# Patient Record
Sex: Female | Born: 1935
Health system: Southern US, Community
[De-identification: ages and names within clinical notes are randomized; demographics above are authoritative.]

---

## 2007-07-10 ENCOUNTER — Ambulatory Visit: Payer: Self-pay | Admitting: Ophthalmology

## 2007-07-10 ENCOUNTER — Other Ambulatory Visit: Payer: Self-pay

## 2007-07-16 ENCOUNTER — Ambulatory Visit: Payer: Self-pay | Admitting: Ophthalmology

## 2009-05-06 ENCOUNTER — Ambulatory Visit: Payer: Self-pay | Admitting: Unknown Physician Specialty

## 2009-06-26 ENCOUNTER — Ambulatory Visit: Payer: Self-pay | Admitting: Ophthalmology

## 2009-07-01 ENCOUNTER — Ambulatory Visit: Payer: Self-pay | Admitting: Cardiology

## 2009-07-06 ENCOUNTER — Ambulatory Visit: Payer: Self-pay | Admitting: Ophthalmology

## 2013-12-23 ENCOUNTER — Ambulatory Visit: Payer: Self-pay | Admitting: Internal Medicine

## 2018-04-18 DIAGNOSIS — N183 Chronic kidney disease, stage 3 (moderate): Secondary | ICD-10-CM | POA: Diagnosis not present

## 2018-04-18 DIAGNOSIS — E1122 Type 2 diabetes mellitus with diabetic chronic kidney disease: Secondary | ICD-10-CM | POA: Diagnosis not present

## 2018-04-18 DIAGNOSIS — I119 Hypertensive heart disease without heart failure: Secondary | ICD-10-CM | POA: Diagnosis not present

## 2018-04-18 DIAGNOSIS — I43 Cardiomyopathy in diseases classified elsewhere: Secondary | ICD-10-CM | POA: Diagnosis not present

## 2018-09-18 DIAGNOSIS — H401132 Primary open-angle glaucoma, bilateral, moderate stage: Secondary | ICD-10-CM | POA: Diagnosis not present

## 2019-01-21 DIAGNOSIS — N183 Chronic kidney disease, stage 3 unspecified: Secondary | ICD-10-CM | POA: Diagnosis not present

## 2019-01-21 DIAGNOSIS — E1122 Type 2 diabetes mellitus with diabetic chronic kidney disease: Secondary | ICD-10-CM | POA: Diagnosis not present

## 2019-01-21 DIAGNOSIS — E782 Mixed hyperlipidemia: Secondary | ICD-10-CM | POA: Diagnosis not present

## 2019-01-22 DIAGNOSIS — E782 Mixed hyperlipidemia: Secondary | ICD-10-CM | POA: Diagnosis not present

## 2019-01-22 DIAGNOSIS — E1122 Type 2 diabetes mellitus with diabetic chronic kidney disease: Secondary | ICD-10-CM | POA: Diagnosis not present

## 2019-01-22 DIAGNOSIS — N183 Chronic kidney disease, stage 3 unspecified: Secondary | ICD-10-CM | POA: Diagnosis not present

## 2019-03-25 DIAGNOSIS — R8290 Unspecified abnormal findings in urine: Secondary | ICD-10-CM | POA: Diagnosis not present

## 2019-03-25 DIAGNOSIS — R350 Frequency of micturition: Secondary | ICD-10-CM | POA: Diagnosis not present

## 2019-05-22 DIAGNOSIS — E1122 Type 2 diabetes mellitus with diabetic chronic kidney disease: Secondary | ICD-10-CM | POA: Diagnosis not present

## 2019-05-22 DIAGNOSIS — E782 Mixed hyperlipidemia: Secondary | ICD-10-CM | POA: Diagnosis not present

## 2019-05-22 DIAGNOSIS — N1831 Chronic kidney disease, stage 3a: Secondary | ICD-10-CM | POA: Diagnosis not present

## 2019-05-22 DIAGNOSIS — I129 Hypertensive chronic kidney disease with stage 1 through stage 4 chronic kidney disease, or unspecified chronic kidney disease: Secondary | ICD-10-CM | POA: Diagnosis not present

## 2020-06-01 ENCOUNTER — Other Ambulatory Visit: Payer: Self-pay

## 2020-06-01 ENCOUNTER — Encounter: Payer: Self-pay | Admitting: Radiology

## 2020-06-01 ENCOUNTER — Emergency Department: Payer: Medicare HMO

## 2020-06-01 ENCOUNTER — Inpatient Hospital Stay
Admission: EM | Admit: 2020-06-01 | Discharge: 2020-06-05 | DRG: 682 | Disposition: A | Discharge status: Skilled Nursing Facility | Payer: Medicare HMO | Attending: Student | Admitting: Student

## 2020-06-01 DIAGNOSIS — I43 Cardiomyopathy in diseases classified elsewhere: Secondary | ICD-10-CM | POA: Diagnosis present

## 2020-06-01 DIAGNOSIS — I82409 Acute embolism and thrombosis of unspecified deep veins of unspecified lower extremity: Secondary | ICD-10-CM

## 2020-06-01 DIAGNOSIS — R32 Unspecified urinary incontinence: Secondary | ICD-10-CM | POA: Diagnosis present

## 2020-06-01 DIAGNOSIS — R4182 Altered mental status, unspecified: Secondary | ICD-10-CM | POA: Diagnosis not present

## 2020-06-01 DIAGNOSIS — M7989 Other specified soft tissue disorders: Secondary | ICD-10-CM | POA: Diagnosis not present

## 2020-06-01 DIAGNOSIS — E785 Hyperlipidemia, unspecified: Secondary | ICD-10-CM | POA: Diagnosis present

## 2020-06-01 DIAGNOSIS — H9190 Unspecified hearing loss, unspecified ear: Secondary | ICD-10-CM | POA: Diagnosis present

## 2020-06-01 DIAGNOSIS — M419 Scoliosis, unspecified: Secondary | ICD-10-CM | POA: Diagnosis present

## 2020-06-01 DIAGNOSIS — G9341 Metabolic encephalopathy: Secondary | ICD-10-CM | POA: Diagnosis not present

## 2020-06-01 DIAGNOSIS — E559 Vitamin D deficiency, unspecified: Secondary | ICD-10-CM | POA: Diagnosis present

## 2020-06-01 DIAGNOSIS — I447 Left bundle-branch block, unspecified: Secondary | ICD-10-CM | POA: Diagnosis present

## 2020-06-01 DIAGNOSIS — W010XXA Fall on same level from slipping, tripping and stumbling without subsequent striking against object, initial encounter: Secondary | ICD-10-CM | POA: Diagnosis present

## 2020-06-01 DIAGNOSIS — Z79899 Other long term (current) drug therapy: Secondary | ICD-10-CM

## 2020-06-01 DIAGNOSIS — I82412 Acute embolism and thrombosis of left femoral vein: Secondary | ICD-10-CM | POA: Diagnosis present

## 2020-06-01 DIAGNOSIS — M6282 Rhabdomyolysis: Secondary | ICD-10-CM

## 2020-06-01 DIAGNOSIS — N179 Acute kidney failure, unspecified: Secondary | ICD-10-CM | POA: Diagnosis not present

## 2020-06-01 DIAGNOSIS — Y92009 Unspecified place in unspecified non-institutional (private) residence as the place of occurrence of the external cause: Secondary | ICD-10-CM

## 2020-06-01 DIAGNOSIS — I129 Hypertensive chronic kidney disease with stage 1 through stage 4 chronic kidney disease, or unspecified chronic kidney disease: Secondary | ICD-10-CM | POA: Diagnosis present

## 2020-06-01 DIAGNOSIS — R410 Disorientation, unspecified: Secondary | ICD-10-CM | POA: Diagnosis not present

## 2020-06-01 DIAGNOSIS — M25551 Pain in right hip: Secondary | ICD-10-CM | POA: Diagnosis present

## 2020-06-01 DIAGNOSIS — E1122 Type 2 diabetes mellitus with diabetic chronic kidney disease: Secondary | ICD-10-CM | POA: Diagnosis present

## 2020-06-01 DIAGNOSIS — N183 Chronic kidney disease, stage 3 unspecified: Secondary | ICD-10-CM | POA: Diagnosis not present

## 2020-06-01 DIAGNOSIS — R296 Repeated falls: Secondary | ICD-10-CM | POA: Diagnosis present

## 2020-06-01 DIAGNOSIS — W19XXXD Unspecified fall, subsequent encounter: Secondary | ICD-10-CM | POA: Diagnosis not present

## 2020-06-01 DIAGNOSIS — S0003XA Contusion of scalp, initial encounter: Secondary | ICD-10-CM | POA: Diagnosis present

## 2020-06-01 DIAGNOSIS — Z90710 Acquired absence of both cervix and uterus: Secondary | ICD-10-CM | POA: Diagnosis not present

## 2020-06-01 DIAGNOSIS — W19XXXA Unspecified fall, initial encounter: Secondary | ICD-10-CM

## 2020-06-01 DIAGNOSIS — R279 Unspecified lack of coordination: Secondary | ICD-10-CM | POA: Diagnosis not present

## 2020-06-01 DIAGNOSIS — E1136 Type 2 diabetes mellitus with diabetic cataract: Secondary | ICD-10-CM | POA: Diagnosis present

## 2020-06-01 DIAGNOSIS — T796XXD Traumatic ischemia of muscle, subsequent encounter: Secondary | ICD-10-CM | POA: Diagnosis not present

## 2020-06-01 DIAGNOSIS — T796XXA Traumatic ischemia of muscle, initial encounter: Secondary | ICD-10-CM | POA: Diagnosis not present

## 2020-06-01 DIAGNOSIS — I82402 Acute embolism and thrombosis of unspecified deep veins of left lower extremity: Secondary | ICD-10-CM | POA: Diagnosis not present

## 2020-06-01 DIAGNOSIS — I824Y9 Acute embolism and thrombosis of unspecified deep veins of unspecified proximal lower extremity: Secondary | ICD-10-CM | POA: Diagnosis not present

## 2020-06-01 DIAGNOSIS — S0083XD Contusion of other part of head, subsequent encounter: Secondary | ICD-10-CM | POA: Diagnosis not present

## 2020-06-01 DIAGNOSIS — N3281 Overactive bladder: Secondary | ICD-10-CM | POA: Diagnosis present

## 2020-06-01 DIAGNOSIS — R0902 Hypoxemia: Secondary | ICD-10-CM | POA: Diagnosis not present

## 2020-06-01 DIAGNOSIS — I10 Essential (primary) hypertension: Secondary | ICD-10-CM | POA: Diagnosis not present

## 2020-06-01 DIAGNOSIS — R404 Transient alteration of awareness: Secondary | ICD-10-CM | POA: Diagnosis not present

## 2020-06-01 DIAGNOSIS — R9431 Abnormal electrocardiogram [ECG] [EKG]: Secondary | ICD-10-CM | POA: Diagnosis not present

## 2020-06-01 DIAGNOSIS — I429 Cardiomyopathy, unspecified: Secondary | ICD-10-CM | POA: Diagnosis not present

## 2020-06-01 DIAGNOSIS — T796XXS Traumatic ischemia of muscle, sequela: Secondary | ICD-10-CM | POA: Diagnosis not present

## 2020-06-01 DIAGNOSIS — I471 Supraventricular tachycardia: Secondary | ICD-10-CM | POA: Diagnosis not present

## 2020-06-01 DIAGNOSIS — I119 Hypertensive heart disease without heart failure: Secondary | ICD-10-CM | POA: Diagnosis not present

## 2020-06-01 DIAGNOSIS — Z20822 Contact with and (suspected) exposure to covid-19: Secondary | ICD-10-CM | POA: Diagnosis present

## 2020-06-01 DIAGNOSIS — R531 Weakness: Secondary | ICD-10-CM | POA: Diagnosis not present

## 2020-06-01 DIAGNOSIS — Z96653 Presence of artificial knee joint, bilateral: Secondary | ICD-10-CM | POA: Diagnosis present

## 2020-06-01 DIAGNOSIS — S199XXA Unspecified injury of neck, initial encounter: Secondary | ICD-10-CM | POA: Diagnosis not present

## 2020-06-01 DIAGNOSIS — Z743 Need for continuous supervision: Secondary | ICD-10-CM | POA: Diagnosis not present

## 2020-06-01 LAB — CBC WITH DIFFERENTIAL/PLATELET
Abs Immature Granulocytes: 0.07 10*3/uL (ref 0.00–0.07)
Basophils Absolute: 0 10*3/uL (ref 0.0–0.1)
Basophils Relative: 0 %
Eosinophils Absolute: 0 10*3/uL (ref 0.0–0.5)
Eosinophils Relative: 0 %
HCT: 42.9 % (ref 36.0–46.0)
Hemoglobin: 14.4 g/dL (ref 12.0–15.0)
Immature Granulocytes: 0 %
Lymphocytes Relative: 11 %
Lymphs Abs: 1.8 10*3/uL (ref 0.7–4.0)
MCH: 33 pg (ref 26.0–34.0)
MCHC: 33.6 g/dL (ref 30.0–36.0)
MCV: 98.2 fL (ref 80.0–100.0)
Monocytes Absolute: 1.2 10*3/uL — ABNORMAL HIGH (ref 0.1–1.0)
Monocytes Relative: 7 %
Neutro Abs: 13.3 10*3/uL — ABNORMAL HIGH (ref 1.7–7.7)
Neutrophils Relative %: 82 %
Platelets: 185 10*3/uL (ref 150–400)
RBC: 4.37 MIL/uL (ref 3.87–5.11)
RDW: 13.9 % (ref 11.5–15.5)
WBC: 16.4 10*3/uL — ABNORMAL HIGH (ref 4.0–10.5)
nRBC: 0 % (ref 0.0–0.2)

## 2020-06-01 LAB — COMPREHENSIVE METABOLIC PANEL
ALT: 19 U/L (ref 0–44)
AST: 62 U/L — ABNORMAL HIGH (ref 15–41)
Albumin: 4.1 g/dL (ref 3.5–5.0)
Alkaline Phosphatase: 53 U/L (ref 38–126)
Anion gap: 13 (ref 5–15)
BUN: 48 mg/dL — ABNORMAL HIGH (ref 8–23)
CO2: 24 mmol/L (ref 22–32)
Calcium: 9.8 mg/dL (ref 8.9–10.3)
Chloride: 103 mmol/L (ref 98–111)
Creatinine, Ser: 1.4 mg/dL — ABNORMAL HIGH (ref 0.44–1.00)
GFR, Estimated: 36 mL/min — ABNORMAL LOW (ref >60)
Glucose, Bld: 128 mg/dL — ABNORMAL HIGH (ref 70–99)
Potassium: 3.6 mmol/L (ref 3.5–5.1)
Sodium: 140 mmol/L (ref 135–145)
Total Bilirubin: 1.2 mg/dL (ref 0.3–1.2)
Total Protein: 7.2 g/dL (ref 6.5–8.1)

## 2020-06-01 LAB — CK: Total CK: 1079 U/L — ABNORMAL HIGH (ref 38–234)

## 2020-06-01 MED ORDER — HEPARIN BOLUS VIA INFUSION
4000.0000 [IU] | Freq: Once | INTRAVENOUS | Status: AC
  Administered 2020-06-02: 4000 [IU] via INTRAVENOUS
  Filled 2020-06-01: qty 4000

## 2020-06-01 MED ORDER — HEPARIN (PORCINE) 25000 UT/250ML-% IV SOLN
1000.0000 [IU]/h | INTRAVENOUS | Status: DC
  Administered 2020-06-02: 1000 [IU]/h via INTRAVENOUS
  Filled 2020-06-01: qty 250

## 2020-06-01 MED ORDER — SODIUM CHLORIDE 0.9 % IV BOLUS: 1000.0000 mL | Freq: Once | INTRAVENOUS | Status: DC

## 2020-06-01 MED ORDER — SODIUM CHLORIDE 0.9 % IV BOLUS
500.0000 mL | Freq: Once | INTRAVENOUS | Status: AC
  Administered 2020-06-01: 500 mL via INTRAVENOUS

## 2020-06-01 NOTE — ED Provider Notes (Signed)
[facility] Emergency Department Provider Note  ____________________________________________  Time seen: Approximately 9:23 PM  I have reviewed the triage vital signs and the nursing notes.   HISTORY  Chief Complaint Fall    Level 5 Caveat: Portions of the History and Physical including HPI and review of systems are unable to be completely obtained due to patient altered mental status  HPI Kristin Browning is a 87 y.o. female with a history of diabetes, CHF  who is brought to the ED due to a fall.  She had potentially been on the floor for several hours before neighbor came and saw and called EMS.  Patient reports that she feels fine, denies complaints except for some right hip pain..     Past medical history significant for diabetes and CHF   Patient Active Problem List   Diagnosis Date Noted  . AKI (acute kidney injury) (HCC) 06/01/2020        Prior to Admission medications   Not on File  Carvedilol Torsemide Diltiazem   Allergies Patient has no allergy information on record.   No family history on file.  Social History    Review of Systems Level 5 Caveat: Portions of the History and Physical including HPI and review of systems are unable to be completely obtained due to patient being a poor historian   Constitutional:   No known fever.  ENT:   No rhinorrhea. Cardiovascular:   No chest pain or syncope. Respiratory:   No dyspnea or cough. Gastrointestinal:   Negative for abdominal pain, vomiting and diarrhea.  Musculoskeletal:   Right hip pain ____________________________________________   PHYSICAL EXAM:  VITAL SIGNS: ED Triage Vitals  Enc Vitals Group     BP 06/01/20 2112 (!) 168/99     Pulse Rate 06/01/20 2112 (!) 107     Resp 06/01/20 2112 18     Temp 06/01/20 2112 98.2 F (36.8 C)     Temp Source 06/01/20 2112 Oral     SpO2 06/01/20 2112 94 %     Weight 06/01/20 2113 128 lb 8.5 oz (58.3 kg)     Height 06/01/20 2113 5'  4" (1.626 m)     Head Circumference --      Peak Flow --      Pain Score 06/01/20 2113 0     Pain Loc --      Pain Edu? --      Excl. in GC? --     Vital signs reviewed, nursing assessments reviewed.   Constitutional:   Alert and oriented to person and place. Non-toxic appearance. Eyes:   Conjunctivae are normal. EOMI. PERRL. ENT      Head:   Normocephalic with scalp hematoma on left forehead.      Nose:   No congestion/rhinnorhea.       Mouth/Throat:   MMM, no pharyngeal erythema. No peritonsillar mass.       Neck:   No meningismus. Full ROM.  No midline C-spine tenderness Hematological/Lymphatic/Immunilogical:   No cervical lymphadenopathy. Cardiovascular:   Tachycardia heart rate 105. Symmetric bilateral radial and DP pulses.  No murmurs. Cap refill less than 2 seconds. Respiratory:   Normal respiratory effort without tachypnea/retractions. Breath sounds are clear and equal bilaterally. No wheezes/rales/rhonchi. Gastrointestinal:   Soft and nontender. Non distended. There is no CVA tenderness.  No rebound, rigidity, or guarding. Musculoskeletal:   Normal range of motion in all extremities.  Tenderness of the right hip.  Left leg swelling with increased  calf circumference compared to right.  No phlegmasia, nontender. Neurologic:   Normal speech and language.  Motor grossly intact. No acute focal neurologic deficits are appreciated.  Skin:    Skin is warm, dry and intact. No rash noted.  No petechiae, purpura, or bullae.  ____________________________________________    LABS (pertinent positives/negatives) (all labs ordered are listed, but only abnormal results are displayed) Labs Reviewed  COMPREHENSIVE METABOLIC PANEL - Abnormal; Notable for the following components:      Result Value   Glucose, Bld 128 (*)    BUN 48 (*)    Creatinine, Ser 1.40 (*)    AST 62 (*)    GFR, Estimated 36 (*)    All other components within normal limits  CBC WITH DIFFERENTIAL/PLATELET -  Abnormal; Notable for the following components:   WBC 16.4 (*)    Neutro Abs 13.3 (*)    Monocytes Absolute 1.2 (*)    All other components within normal limits  CK - Abnormal; Notable for the following components:   Total CK 1,079 (*)    All other components within normal limits  RESP PANEL BY RT-PCR (FLU A&B, COVID) [id number]  URINALYSIS, COMPLETE (UACMP) WITH MICROSCOPIC  APTT  PROTIME-INR   ____________________________________________   EKG  Interpreted by me Sinus tachycardia rate 104.  Left axis, normal intervals.  Poor R wave progression.  Left bundle branch block.  No acute ischemic changes.  ____________________________________________    RADIOLOGY  DG Pelvis 1-2 Views  Result Date: 06/01/2020 CLINICAL DATA:  Weakness, fall, right hip pain EXAM: PELVIS - 1-2 VIEW COMPARISON:  None. FINDINGS: Mild patient rotation. No evidence of acute fracture. Both femoral heads are seated in the acetabulum. Pubic rami are grossly intact. Degenerative change of the pubic symphysis which remains congruent. The bones are diffusely under mineralized. There are vascular calcifications. IMPRESSION: No evidence of acute fracture of the pelvis. Electronically Signed   By: Narda RutherfordMelanie  Sanford M.D.   On: 06/01/2020 22:52   CT Head Wo Contrast  Result Date: 06/01/2020 CLINICAL DATA:  Unwitnessed fall. EXAM: CT HEAD WITHOUT CONTRAST TECHNIQUE: Contiguous axial images were obtained from the base of the skull through the vertex without intravenous contrast. COMPARISON:  None. FINDINGS: Brain: No intracranial hemorrhage, mass effect, or midline shift. Generalized atrophy, normal for age. No hydrocephalus. The basilar cisterns are patent. Moderate periventricular and deep white matter hypodensity typical of chronic small vessel ischemia. Suspect small periventricular lacunar infarct in the left frontal lobe. No evidence of territorial infarct or acute ischemia. No extra-axial or intracranial fluid collection.  Small incidental falx lipoma. Vascular: Atherosclerosis of skullbase vasculature without hyperdense vessel or abnormal calcification. Skull: No fracture or focal lesion. Sinuses/Orbits: Paranasal sinuses and mastoid air cells are clear. The visualized orbits are unremarkable. Bilateral cataract resection. Other: Small left frontal scalp contusion. IMPRESSION: 1. Small left frontal scalp contusion. No acute intracranial abnormality. No skull fracture. 2. Age related atrophy and chronic small vessel ischemia. Electronically Signed   By: Narda RutherfordMelanie  Sanford M.D.   On: 06/01/2020 21:37   CT Cervical Spine Wo Contrast  Result Date: 06/01/2020 CLINICAL DATA:  Neck trauma (Age >= 65y) Unwitnessed fall. EXAM: CT CERVICAL SPINE WITHOUT CONTRAST TECHNIQUE: Multidetector CT imaging of the cervical spine was performed without intravenous contrast. Multiplanar CT image reconstructions were also generated. COMPARISON:  None. FINDINGS: Alignment: Trace anterolisthesis of C7 on T1, likely degenerative. No traumatic subluxation. Skull base and vertebrae: No acute fracture. Vertebral body heights are maintained. The dens and skull  base are intact. Partial ankylosis of left C2-C3 facet is likely degenerative. Soft tissues and spinal canal: No prevertebral fluid or swelling. No visible canal hematoma. Disc levels: Diffuse disc space narrowing and endplate spurring. Mild multilevel facet hypertrophy. No high-grade canal stenosis. Upper chest: No acute or unexpected findings. Other: None. IMPRESSION: Degenerative change in the cervical spine without acute fracture or subluxation. Electronically Signed   By: Narda Rutherford M.D.   On: 06/01/2020 21:40   US Venous Img Lower Unilateral Left  Addendum Date: 06/01/2020   ADDENDUM REPORT: 06/01/2020 22:58 ADDENDUM: It should be noted that the previously described areas of LEFT lower extremity DVT are nonocclusive in nature. Electronically Signed   By: Aram Candela M.D.   On:  06/01/2020 22:58   Result Date: 06/01/2020 CLINICAL DATA:  Left lower extremity swelling. EXAM: LEFT LOWER EXTREMITY VENOUS DOPPLER ULTRASOUND TECHNIQUE: Gray-scale sonography with compression, as well as color and duplex ultrasound, were performed to evaluate the deep venous system(s) from the level of the common femoral vein through the popliteal and proximal calf veins. COMPARISON:  None. FINDINGS: VENOUS There is incomplete compressibility of the LEFT common femoral vein, proximal LEFT saphenous vein and proximal LEFT superficial femoral vein. There is normal compressibility of the mid and distal LEFT superficial femoral, and LEFT popliteal veins, as well as the visualized LEFT calf veins. Echogenic filling defects are also seen within the LEFT common femoral vein, proximal LEFT saphenous vein and proximal LEFT superficial femoral vein on grayscale or color Doppler imaging. Doppler waveforms show normal direction of venous flow and normal respiratory plasticity. Limited views of the contralateral common femoral vein are unremarkable. OTHER None. Limitations: none IMPRESSION: Findings consistent with DVT within the LEFT common femoral vein, proximal LEFT saphenous vein and proximal LEFT superficial femoral vein. Electronically Signed: By: Aram Candela M.D. On: 06/01/2020 22:49   DG Chest Portable 1 View  Result Date: 06/01/2020 CLINICAL DATA:  Weakness, fall, right hip pain.  Unwitnessed fall. EXAM: PORTABLE CHEST 1 VIEW COMPARISON:  None. FINDINGS: Significant patient rotation. The heart is normal in size. Aortic atherosclerosis. No evidence of focal airspace disease, pleural effusion, or pneumothorax. No pulmonary edema. Degenerative change of the left shoulder. Scoliotic curvature of the spine. No acute osseous abnormalities are seen. IMPRESSION: Rotated exam without evidence of acute traumatic injury to the chest. Electronically Signed   By: Narda Rutherford M.D.   On: 06/01/2020 22:51     ____________________________________________   PROCEDURES Procedures  ____________________________________________  DIFFERENTIAL DIAGNOSIS   Intracranial hemorrhage, C-spine fracture, right hip fracture, dehydration, Normality, rhabdomyolysis  CLINICAL IMPRESSION / ASSESSMENT AND PLAN / ED COURSE  Medications ordered in the ED: Medications  heparin bolus via infusion 4,000 Units (has no administration in time range)  heparin ADULT infusion 100 units/mL (25000 units/272mL) (has no administration in time range)  sodium chloride 0.9 % bolus 500 mL (500 mLs Intravenous New Bag/Given 06/01/20 2336)    Pertinent labs & imaging results that were available during my care of the patient were reviewed by me and considered in my medical decision making (see chart for details).   Rachna LASHALA LASER was evaluated in Emergency Department on 06/01/2020 for the symptoms described in the history of present illness. She was evaluated in the context of the global COVID-19 pandemic, which necessitated consideration that the patient might be at risk for infection with the SARS-CoV-2 virus that causes COVID-19. Institutional protocols and algorithms that pertain to the evaluation of patients at risk for COVID-19  are in a state of rapid change based on information released by regulatory bodies including the CDC and federal and state organizations. These policies and algorithms were followed during the patient's care in the ED.   Patient presents after unwitnessed fall at home, found down.  Will obtain CTs of head and neck, hip x-ray and chest x-ray, labs.  Will be fluids for hydration.  ----------------------------------------- 11:52 PM on 06/01/2020 -----------------------------------------  Ultrasound discussed with radiology which shows nonocclusive proximal DVT in the common femoral vein which is felt to be acute.  Labs show elevated CK level and creatinine concerning for AKI and rhabdomyolysis.  We will  continue IV fluids, heparin infusion.  Will need to admit for altered mental status, inability to care for self with her current acute illness.  We will continue to try to obtain urinalysis to rule out UTI as well.  Case discussed with hospitalist.      ____________________________________________   FINAL CLINICAL IMPRESSION(S) / ED DIAGNOSES    Final diagnoses:  Altered mental status, unspecified altered mental status type  Acute deep vein thrombosis (DVT) of femoral vein of left lower extremity (HCC)  AKI (acute kidney injury) [facility])     ED Discharge Orders    None      Portions of this note were generated with dragon dictation software. Dictation errors may occur despite best attempts at proofreading.   Sharman Cheek, MD 06/01/20 [phone]

## 2020-06-01 NOTE — ED Triage Notes (Signed)
Pt to ED via EMS from home. Per EMS, pt had an unwitnessed fall. Pt is not complaining of any pain. Pt is not on blood thinners. Pt is incontinent. Upon arrival, pt has AMS, but this is not her normal. Pt is ambulatory with a walker. EMS vitals: BP 181/109 HR 104, O2 sat 99% on room air.

## 2020-06-01 NOTE — Progress Notes (Signed)
ANTICOAGULATION CONSULT NOTE - Initial Consult  Pharmacy Consult for Heparin  Indication: DVT  Not on File  Patient Measurements: Height: 5\' 4"  (162.6 cm) Weight: 58.3 kg (128 lb 8.5 oz) IBW/kg (Calculated) : 54.7 Heparin Dosing Weight:  58.3 kg   Vital Signs: Temp: 98.2 F (36.8 C) (03/14 2112) Temp Source: Oral (03/14 2112) BP: 168/99 (03/14 2112) Pulse Rate: 107 (03/14 2112)  Labs: Recent Labs    06/01/20 2116  HGB 14.4  HCT 42.9  PLT 185  CREATININE 1.40*  CKTOTAL 1,079*    Estimated Creatinine Clearance: 24 mL/min (A) (by C-G formula based on SCr of 1.4 mg/dL (H)).   Medical History: History reviewed. No pertinent past medical history.  Medications:  (Not in a hospital admission)   Assessment: Pharmacy consulted to dose heparin in this 87 year old female admitted with DVT.  CrCl = 24 ml/min No prior anticoag noted.   Goal of Therapy:  Heparin level 0.3-0.7 units/ml Monitor platelets by anticoagulation protocol: Yes   Plan:  Give 4000 units bolus x 1 Start heparin infusion at 1000 units/hr Check anti-Xa level in 8 hours and daily while on heparin Continue to monitor H&H and platelets  Graig Hessling D 06/01/2020,11:44 PM

## 2020-06-02 ENCOUNTER — Other Ambulatory Visit: Payer: Self-pay

## 2020-06-02 DIAGNOSIS — I82409 Acute embolism and thrombosis of unspecified deep veins of unspecified lower extremity: Secondary | ICD-10-CM

## 2020-06-02 DIAGNOSIS — M6282 Rhabdomyolysis: Secondary | ICD-10-CM

## 2020-06-02 DIAGNOSIS — I824Y9 Acute embolism and thrombosis of unspecified deep veins of unspecified proximal lower extremity: Secondary | ICD-10-CM

## 2020-06-02 DIAGNOSIS — T796XXS Traumatic ischemia of muscle, sequela: Secondary | ICD-10-CM

## 2020-06-02 LAB — URINALYSIS, COMPLETE (UACMP) WITH MICROSCOPIC
Bilirubin Urine: NEGATIVE
Glucose, UA: NEGATIVE mg/dL
Ketones, ur: 5 mg/dL — AB
Nitrite: POSITIVE — AB
Protein, ur: 30 mg/dL — AB
Specific Gravity, Urine: 1.023 (ref 1.005–1.030)
pH: 7 (ref 5.0–8.0)

## 2020-06-02 LAB — CBC
HCT: 40.1 % (ref 36.0–46.0)
Hemoglobin: 13.4 g/dL (ref 12.0–15.0)
MCH: 33.4 pg (ref 26.0–34.0)
MCHC: 33.4 g/dL (ref 30.0–36.0)
MCV: 100 fL (ref 80.0–100.0)
Platelets: 154 10*3/uL (ref 150–400)
RBC: 4.01 MIL/uL (ref 3.87–5.11)
RDW: 14.4 % (ref 11.5–15.5)
WBC: 16.8 10*3/uL — ABNORMAL HIGH (ref 4.0–10.5)
nRBC: 0 % (ref 0.0–0.2)

## 2020-06-02 LAB — PROTIME-INR
INR: 1.2 (ref 0.8–1.2)
Prothrombin Time: 15.1 seconds (ref 11.4–15.2)

## 2020-06-02 LAB — BASIC METABOLIC PANEL
Anion gap: 9 (ref 5–15)
BUN: 47 mg/dL — ABNORMAL HIGH (ref 8–23)
CO2: 25 mmol/L (ref 22–32)
Calcium: 9.2 mg/dL (ref 8.9–10.3)
Chloride: 108 mmol/L (ref 98–111)
Creatinine, Ser: 1.28 mg/dL — ABNORMAL HIGH (ref 0.44–1.00)
GFR, Estimated: 40 mL/min — ABNORMAL LOW (ref >60)
Glucose, Bld: 97 mg/dL (ref 70–99)
Potassium: 4.9 mmol/L (ref 3.5–5.1)
Sodium: 142 mmol/L (ref 135–145)

## 2020-06-02 LAB — RESP PANEL BY RT-PCR (FLU A&B, COVID) ARPGX2
Influenza A by PCR: NEGATIVE
Influenza B by PCR: NEGATIVE
SARS Coronavirus 2 by RT PCR: NEGATIVE

## 2020-06-02 LAB — APTT: aPTT: 27 seconds (ref 24–36)

## 2020-06-02 LAB — HEPARIN LEVEL (UNFRACTIONATED)
Heparin Unfractionated: 1.02 IU/mL — ABNORMAL HIGH (ref 0.30–0.70)
Heparin Unfractionated: 0.99 IU/mL — ABNORMAL HIGH (ref 0.30–0.70)

## 2020-06-02 LAB — GLUCOSE, CAPILLARY
Glucose-Capillary: 91 mg/dL (ref 70–99)
Glucose-Capillary: 75 mg/dL (ref 70–99)
Glucose-Capillary: 94 mg/dL (ref 70–99)

## 2020-06-02 LAB — MAGNESIUM: Magnesium: 2.7 mg/dL — ABNORMAL HIGH (ref 1.7–2.4)

## 2020-06-02 LAB — HEMOGLOBIN A1C
Hgb A1c MFr Bld: 5.3 % (ref 4.8–5.6)
Mean Plasma Glucose: 105.41 mg/dL

## 2020-06-02 LAB — CK: Total CK: 701 U/L — ABNORMAL HIGH (ref 38–234)

## 2020-06-02 LAB — CBG MONITORING, ED: Glucose-Capillary: 95 mg/dL (ref 70–99)

## 2020-06-02 MED ORDER — ACETAMINOPHEN 325 MG PO TABS: 650.0000 mg | ORAL_TABLET | Freq: Four times a day (QID) | ORAL | Status: DC | PRN

## 2020-06-02 MED ORDER — ONDANSETRON HCL 4 MG/2ML IJ SOLN: 4.0000 mg | Freq: Four times a day (QID) | INTRAMUSCULAR | Status: DC | PRN

## 2020-06-02 MED ORDER — ACETAMINOPHEN 650 MG RE SUPP: 650.0000 mg | Freq: Four times a day (QID) | RECTAL | Status: DC | PRN

## 2020-06-02 MED ORDER — MORPHINE SULFATE (PF) 2 MG/ML IV SOLN: 2.0000 mg | INTRAVENOUS | Status: DC | PRN

## 2020-06-02 MED ORDER — MORPHINE SULFATE (PF) 2 MG/ML IV SOLN
1.0000 mg | INTRAVENOUS | Status: DC | PRN
Start: 2020-06-02 — End: 2020-06-05

## 2020-06-02 MED ORDER — MAGNESIUM HYDROXIDE 400 MG/5ML PO SUSP
30.0000 mL | Freq: Every day | ORAL | Status: DC | PRN
Start: 2020-06-02 — End: 2020-06-05

## 2020-06-02 MED ORDER — INFLUENZA VAC A&B SA ADJ QUAD 0.5 ML IM PRSY
0.5000 mL | PREFILLED_SYRINGE | INTRAMUSCULAR | Status: DC
  Filled 2020-06-02: qty 0.5

## 2020-06-02 MED ORDER — ACETAMINOPHEN 325 MG PO TABS
650.0000 mg | ORAL_TABLET | Freq: Four times a day (QID) | ORAL | Status: DC | PRN
  Filled 2020-06-02: qty 2

## 2020-06-02 MED ORDER — ONDANSETRON HCL 4 MG PO TABS: 4.0000 mg | ORAL_TABLET | Freq: Four times a day (QID) | ORAL | Status: DC | PRN

## 2020-06-02 MED ORDER — INSULIN ASPART 100 UNIT/ML ~~LOC~~ SOLN: 0.0000 [IU] | Freq: Three times a day (TID) | SUBCUTANEOUS | Status: DC

## 2020-06-02 MED ORDER — SODIUM CHLORIDE 0.9 % IV SOLN: INTRAVENOUS | Status: DC

## 2020-06-02 MED ORDER — HEPARIN (PORCINE) 25000 UT/250ML-% IV SOLN
750.0000 [IU]/h | INTRAVENOUS | Status: DC
  Administered 2020-06-02: 750 [IU]/h via INTRAVENOUS
  Filled 2020-06-02: qty 250

## 2020-06-02 MED ORDER — POTASSIUM CHLORIDE IN NACL 20-0.9 MEQ/L-% IV SOLN
INTRAVENOUS | Status: DC
  Filled 2020-06-02: qty 1000

## 2020-06-02 MED ORDER — TRAZODONE HCL 50 MG PO TABS
25.0000 mg | ORAL_TABLET | Freq: Every evening | ORAL | Status: DC | PRN
  Administered 2020-06-04: 25 mg via ORAL
  Filled 2020-06-02: qty 1

## 2020-06-02 MED ORDER — POTASSIUM CHLORIDE 20 MEQ PO PACK
40.0000 meq | PACK | Freq: Once | ORAL | Status: AC
  Administered 2020-06-02: 40 meq via ORAL
  Filled 2020-06-02: qty 2

## 2020-06-02 NOTE — Progress Notes (Signed)
ANTICOAGULATION CONSULT NOTE   Pharmacy Consult for Heparin  Indication: DVT  Patient Measurements: Heparin Dosing Weight:  58.3 kg   Labs: Recent Labs    06/01/20 2116 06/02/20 0100 06/02/20 0656 06/02/20 0953 06/02/20 2000  HGB 14.4  --  13.4  --   --   HCT 42.9  --  40.1  --   --   PLT 185  --  154  --   --   APTT  --  27  --   --   --   LABPROT  --  15.1  --   --   --   INR  --  1.2  --   --   --   HEPARINUNFRC  --   --   --  1.02* 0.99*  CREATININE 1.40*  --  1.28*  --   --   CKTOTAL 1,079*  --  701*  --   --     Estimated Creatinine Clearance: 26.2 mL/min (A) (by C-G formula based on SCr of 1.28 mg/dL (H)).   Medical History: History reviewed. No pertinent past medical history.  Medications:  Medications Prior to Admission  Medication Sig Dispense Refill Last Dose  . oxybutynin (DITROPAN-XL) 10 MG 24 hr tablet Take 10 mg by mouth daily.   Unknown at Unknown    Assessment: Pharmacy consulted to dose heparin in this 87 year old female admitted with DVT.  No prior anticoag noted. [place] of LE: Findings consistent with DVT within the LEFT common femoral vein, proximal LEFT saphenous vein and proximal LEFT superficial femoral vein.  3/15 0953 HL 1.02: Hold heparin infusion x 1 hour and restart at 900 units/hr 3/15 2000 HL 0.99: Decrease heparin infusion to 750 units/hr  Goal of Therapy:  Heparin level 0.3-0.7 units/ml Monitor platelets by anticoagulation protocol: Yes   Plan:  --Heparin level is supratherapeutic. Will decrease heparin infusion rate to 750 units/hr --Re-check HL 8 hours after rate change --Daily CBC per protocol while on IV heparin  Tressie Ellis 06/02/2020,8:52 PM

## 2020-06-02 NOTE — TOC Initial Note (Signed)
Transition of Care [facility]) - Initial/Assessment Note    Patient Details  Name: Kristin Browning MRN: [id number] Date of Birth: 08-18-1929  [facility] CM/SW Contact:    Chapman Fitch, RN Phone Number: 06/02/2020, 3:40 PM  Clinical Narrative:                  Patient admitted from home. Found to have DVT Due to AMS assessment completed via phone with daughter.   Per daughter patient lives at home alone If patient is appropriate to discharge home daughter states she will be discharging the the daughters home [address] [place] [address].  Daughter states if patient returns will need hospital bed, lift, WC   PT eval pending.  If SNF recommended daughter is in agreement  PCP [facility] Denies issues obtaining medications  Daughter provides transportation to appointments    Following for determination of eliquis vs xarelto   Expected Discharge Plan: Home w Home Health Services     Patient Goals and CMS Choice        Expected Discharge Plan and Services Expected Discharge Plan: Home w Home Health Services   Discharge Planning Services: CM Consult   Living arrangements for the past 2 months: Single Family Home                                      Prior Living Arrangements/Services Living arrangements for the past 2 months: Single Family Home Lives with:: Self Patient language and need for interpreter reviewed:: Yes Do you feel safe going back to the place where you live?: Yes      Need for Family Participation in Patient Care: Yes (Comment) Care giver support system in place?: Yes (comment) Current home services: DME Criminal Activity/Legal Involvement Pertinent to Current Situation/Hospitalization: No - Comment as needed  Activities of Daily Living      Permission Sought/Granted                  Emotional Assessment       Orientation: : Oriented to Self,Oriented to Place,Fluctuating Orientation (Suspected and/or  reported Sundowners)      Admission diagnosis:  AKI (acute kidney injury) (HCC) [N17.9] Acute deep vein thrombosis (DVT) of femoral vein of left lower extremity (HCC) [I82.412] Altered mental status, unspecified altered mental status type [R41.82] Patient Active Problem List   Diagnosis Date Noted  . DVT (deep venous thrombosis) (HCC) 06/02/2020  . Rhabdomyolysis 06/02/2020  . AKI (acute kidney injury) (HCC) 06/01/2020   PCP:  Lauro Regulus, MD Pharmacy:   [facility] [phone] - [place], [place] - [address] [place] [address] Phone: [phone] Fax: [phone]     Social Determinants of Health (SDOH) Interventions    Readmission Risk Interventions No flowsheet data found.

## 2020-06-02 NOTE — ED Notes (Signed)
Pt placed on purewick 

## 2020-06-02 NOTE — Progress Notes (Signed)
ANTICOAGULATION CONSULT NOTE   Pharmacy Consult for Heparin  Indication: DVT  Not on File  Patient Measurements: Height: 5\' 4"  (162.6 cm) Weight: 58.3 kg (128 lb 8.5 oz) IBW/kg (Calculated) : 54.7 Heparin Dosing Weight:  58.3 kg   Vital Signs: BP: 111/72 (03/15 0800) Pulse Rate: 80 (03/15 0812)  Labs: Recent Labs    06/01/20 2116 06/02/20 0100 06/02/20 0656 06/02/20 0953  HGB 14.4  --  13.4  --   HCT 42.9  --  40.1  --   PLT 185  --  154  --   APTT  --  27  --   --   LABPROT  --  15.1  --   --   INR  --  1.2  --   --   HEPARINUNFRC  --   --   --  1.02*  CREATININE 1.40*  --  1.28*  --   CKTOTAL 1,079*  --  701*  --     Estimated Creatinine Clearance: 26.2 mL/min (A) (by C-G formula based on SCr of 1.28 mg/dL (H)).   Medical History: History reviewed. No pertinent past medical history.  Medications:  (Not in a hospital admission)   Assessment: Pharmacy consulted to dose heparin in this 87 year old female admitted with DVT.  No prior anticoag noted. 98 of LE: Findings consistent with DVT within the LEFT common femoral vein, proximal LEFT saphenous vein and proximal LEFT superficial femoral vein.  3/15 0953 HL 1.02 plan to hold heparin infusion for 1 hour and restart at 900 units/hr.  Goal of Therapy:  Heparin level 0.3-0.7 units/ml Monitor platelets by anticoagulation protocol: Yes   Plan:  Heparin level is supratherapeutic. Will plan to hold heparin infusion for 1 hour and restart at 900 units/hr. Recheck heparin level in 8 hours. CBC daily while on heparin.   4/15 PharmD,  06/02/2020,10:45 AM

## 2020-06-02 NOTE — Progress Notes (Signed)
Responded to consult for IV. On arrival. IV obtained by CN. Consult cleared.

## 2020-06-02 NOTE — Progress Notes (Signed)
Unable to complete admission profile due to pt mentation. Pt confused, attempted to call daughter however n./a.

## 2020-06-02 NOTE — ED Notes (Signed)
Lab contacted to collect routine morning labs due to multiple sticks and IV infiltrations.

## 2020-06-02 NOTE — ED Notes (Signed)
Lab contacted to collect pt timed heparin level

## 2020-06-02 NOTE — H&P (Signed)
[facility]   PATIENT NAME: Kristin Browning    MR#:  [id number]  DATE OF BIRTH:  08-Oct-1929  DATE OF ADMISSION:  06/01/2020  PRIMARY CARE PHYSICIAN: Lauro Regulus, MD   Patient is coming from: Home  REQUESTING/REFERRING PHYSICIAN: Alfonse Flavors, MD  CHIEF COMPLAINT:   Chief Complaint  Patient presents with  . Fall    HISTORY OF PRESENT ILLNESS:  Kristin Browning is a 87 y.o. Caucasian female with medical history significant for hypertension, hypertensive cardiomyopathy, SVT, type 2 diabetes mellitus with stage III chronic kidney disease, dyslipidemia and urinary incontinence who presented to the emergency room after having an unwitnessed fall at home where she was apparently on the ground for a few hours before her neighbor came and saw her then called EMS.  She was on the ground for about an hour and a half per her daughter a left it at 6 PM and came back at 7:30 PM. She denies any presyncope or syncope however she said to EMS that she may have passed out.  She is not sure if she remembers everything however is able to tell me that she was getting wood to heat her house when she lost balance and fell.  Sustaining a left frontal hematoma.  She usually ambulates with a walker.  She was incontinent.  Per EMS her blood pressure was elevated at 181/109 with a heart rate of 104 with normal pulse oximetry.  She was having right hip pain.  She denied any left leg pain until she fell.  No recent travels or surgeries.  ED Course: Presented to the ER blood pressure was 168/99 with heart rate of 107 with otherwise normal vital signs.  Labs revealed potassium of 3.6 and a glucose of 128, BUN of 48 and creatinine 1.4 magnesium 2.7.  CK was 1079 CBC showed leukocytosis 16.4 with neutrophilia.  Influenza COVID-19 PCR came back negative. EKG as reviewed by me : EKG showed sinus tachycardia with rate of 104 with left bundle branch block. Imaging: Chest x-ray showed renal beginnings a.m. without  evidence of acute traumatic injury.  It showed  iatrogenic changes in the left shoulder and scoliosis of the spine..  Right hip x-ray showed no fracture. Left lower extremity venous Doppler was positive for DVT involving the left common femoral vein, proximal left saphenous vein proximal left superficial femoral vein and DVT was nonocclusive.  The patient was given 500 mL IV normal saline bolus and was started on IV heparin bolus followed by drip.  She will be admitted to a medically monitored bed for further evaluation and management.   PAST MEDICAL HISTORY:  Hypertension, CHF, hypertensive cardiomyopathy, SVT, type 2 diabetes mellitus with stage III chronic kidney disease, dyslipidemia, scoliosis, hearing loss and urinary incontinence.   PAST SURGICAL HISTORY:   . Bilateral total knee arthroplasties  . HYSTERECTOMY   SOCIAL HISTORY:   Social History   Tobacco Use  . Smoking status: Not on file  . Smokeless tobacco: Not on file  Substance Use Topics  . Alcohol use: Not on file  No history of tobacco, EtOH abuse or illicit drug use.  FAMILY HISTORY:  No family history on file. No reported pertinent familial diseases.   DRUG ALLERGIES:  Not on File  REVIEW OF SYSTEMS:   ROS As per history of present illness. All pertinent systems were reviewed above. Constitutional, HEENT, cardiovascular, respiratory, GI, GU, musculoskeletal, neuro, psychiatric, endocrine, integumentary and hematologic systems were reviewed and are  otherwise negative/unremarkable except for positive findings mentioned above in the HPI.   MEDICATIONS AT HOME:   Prior to Admission medications   Not on File      VITAL SIGNS:  Blood pressure (!) 168/99, pulse (!) 107, temperature 98.2 F (36.8 C), temperature source Oral, resp. rate 18, height 5\' 4"  (1.626 m), weight 58.3 kg, SpO2 94 %.  PHYSICAL EXAMINATION:  Physical Exam  GENERAL:  87 y.o.-year-old Caucasian female patient lying in the bed with no  acute distress.  EYES: Pupils equal, round, reactive to light and accommodation. No scleral icterus. Extraocular muscles intact.  HEENT: Head with left forehead hematoma with mild tenderness.  Normocephalic. Oropharynx and nasopharynx clear.  NECK:  Supple, no jugular venous distention. No thyroid enlargement, no tenderness.  LUNGS: Normal breath sounds bilaterally, no wheezing, rales,rhonchi or crepitation. No use of accessory muscles of respiration.  CARDIOVASCULAR: Regular rate and rhythm, S1, S2 normal. No murmurs, rubs, or gallops.  ABDOMEN: Soft, nondistended, nontender. Bowel sounds present. No organomegaly or mass.  EXTREMITIES: Generalized left leg swelling with negative Homans' sign.  No cyanosis, or clubbing.  NEUROLOGIC: Cranial nerves II through XII are intact. Muscle strength 5/5 in all extremities. Sensation intact. Gait not checked.  PSYCHIATRIC: The patient is alert and oriented x 3.  Normal affect and good eye contact. SKIN: No obvious rash, lesion, or ulcer.   LABORATORY PANEL:   CBC Recent Labs  Lab 06/01/20 2116  WBC 16.4*  HGB 14.4  HCT 42.9  PLT 185   ------------------------------------------------------------------------------------------------------------------  Chemistries  Recent Labs  Lab 06/01/20 2116  NA 140  K 3.6  CL 103  CO2 24  GLUCOSE 128*  BUN 48*  CREATININE 1.40*  CALCIUM 9.8  AST 62*  ALT 19  ALKPHOS 53  BILITOT 1.2   ------------------------------------------------------------------------------------------------------------------  Cardiac Enzymes No results for input(s): TROPONINI in the last 168 hours. ------------------------------------------------------------------------------------------------------------------  RADIOLOGY:  DG Pelvis 1-2 Views  Result Date: 06/01/2020 CLINICAL DATA:  Weakness, fall, right hip pain EXAM: PELVIS - 1-2 VIEW COMPARISON:  None. FINDINGS: Mild patient rotation. No evidence of acute fracture.  Both femoral heads are seated in the acetabulum. Pubic rami are grossly intact. Degenerative change of the pubic symphysis which remains congruent. The bones are diffusely under mineralized. There are vascular calcifications. IMPRESSION: No evidence of acute fracture of the pelvis. Electronically Signed   By: 06/03/2020 M.D.   On: 06/01/2020 22:52   CT Head Wo Contrast  Result Date: 06/01/2020 CLINICAL DATA:  Unwitnessed fall. EXAM: CT HEAD WITHOUT CONTRAST TECHNIQUE: Contiguous axial images were obtained from the base of the skull through the vertex without intravenous contrast. COMPARISON:  None. FINDINGS: Brain: No intracranial hemorrhage, mass effect, or midline shift. Generalized atrophy, normal for age. No hydrocephalus. The basilar cisterns are patent. Moderate periventricular and deep white matter hypodensity typical of chronic small vessel ischemia. Suspect small periventricular lacunar infarct in the left frontal lobe. No evidence of territorial infarct or acute ischemia. No extra-axial or intracranial fluid collection. Small incidental falx lipoma. Vascular: Atherosclerosis of skullbase vasculature without hyperdense vessel or abnormal calcification. Skull: No fracture or focal lesion. Sinuses/Orbits: Paranasal sinuses and mastoid air cells are clear. The visualized orbits are unremarkable. Bilateral cataract resection. Other: Small left frontal scalp contusion. IMPRESSION: 1. Small left frontal scalp contusion. No acute intracranial abnormality. No skull fracture. 2. Age related atrophy and chronic small vessel ischemia. Electronically Signed   By: 06/03/2020 M.D.   On: 06/01/2020 21:37  CT Cervical Spine Wo Contrast  Result Date: 06/01/2020 CLINICAL DATA:  Neck trauma (Age >= 65y) Unwitnessed fall. EXAM: CT CERVICAL SPINE WITHOUT CONTRAST TECHNIQUE: Multidetector CT imaging of the cervical spine was performed without intravenous contrast. Multiplanar CT image reconstructions were  also generated. COMPARISON:  None. FINDINGS: Alignment: Trace anterolisthesis of C7 on T1, likely degenerative. No traumatic subluxation. Skull base and vertebrae: No acute fracture. Vertebral body heights are maintained. The dens and skull base are intact. Partial ankylosis of left C2-C3 facet is likely degenerative. Soft tissues and spinal canal: No prevertebral fluid or swelling. No visible canal hematoma. Disc levels: Diffuse disc space narrowing and endplate spurring. Mild multilevel facet hypertrophy. No high-grade canal stenosis. Upper chest: No acute or unexpected findings. Other: None. IMPRESSION: Degenerative change in the cervical spine without acute fracture or subluxation. Electronically Signed   By: Narda Rutherford M.D.   On: 06/01/2020 21:40   US Venous Img Lower Unilateral Left  Addendum Date: 06/01/2020   ADDENDUM REPORT: 06/01/2020 22:58 ADDENDUM: It should be noted that the previously described areas of LEFT lower extremity DVT are nonocclusive in nature. Electronically Signed   By: Aram Candela M.D.   On: 06/01/2020 22:58   Result Date: 06/01/2020 CLINICAL DATA:  Left lower extremity swelling. EXAM: LEFT LOWER EXTREMITY VENOUS DOPPLER ULTRASOUND TECHNIQUE: Gray-scale sonography with compression, as well as color and duplex ultrasound, were performed to evaluate the deep venous system(s) from the level of the common femoral vein through the popliteal and proximal calf veins. COMPARISON:  None. FINDINGS: VENOUS There is incomplete compressibility of the LEFT common femoral vein, proximal LEFT saphenous vein and proximal LEFT superficial femoral vein. There is normal compressibility of the mid and distal LEFT superficial femoral, and LEFT popliteal veins, as well as the visualized LEFT calf veins. Echogenic filling defects are also seen within the LEFT common femoral vein, proximal LEFT saphenous vein and proximal LEFT superficial femoral vein on grayscale or color Doppler imaging.  Doppler waveforms show normal direction of venous flow and normal respiratory plasticity. Limited views of the contralateral common femoral vein are unremarkable. OTHER None. Limitations: none IMPRESSION: Findings consistent with DVT within the LEFT common femoral vein, proximal LEFT saphenous vein and proximal LEFT superficial femoral vein. Electronically Signed: By: Aram Candela M.D. On: 06/01/2020 22:49   DG Chest Portable 1 View  Result Date: 06/01/2020 CLINICAL DATA:  Weakness, fall, right hip pain.  Unwitnessed fall. EXAM: PORTABLE CHEST 1 VIEW COMPARISON:  None. FINDINGS: Significant patient rotation. The heart is normal in size. Aortic atherosclerosis. No evidence of focal airspace disease, pleural effusion, or pneumothorax. No pulmonary edema. Degenerative change of the left shoulder. Scoliotic curvature of the spine. No acute osseous abnormalities are seen. IMPRESSION: Rotated exam without evidence of acute traumatic injury to the chest. Electronically Signed   By: Narda Rutherford M.D.   On: 06/01/2020 22:51      IMPRESSION AND PLAN:  Active Problems:   AKI (acute kidney injury) (HCC)  1.  Acute kidney injury likely prerenal secondary to volume depletion and dehydration with associated altered mental status like secondary to metabolic encephalopathy. -The patient will be admitted to the medical monitored bed for -She will be hydrated with IV normal saline. -We will follow BMPs. -We will hold off diuretics and nephrotoxins.  2.  Fall with subsequent acute rhabdomyolysis. -We will follow daily CK level with hydration with IV normal saline. -PT consult will be obtained. -We will hold off her statin therapy. -We will  hold off her Pamelor.  3.  Left lower extremity DVT. -We will continue her on IV heparin drip. -This could be secondary to nonambulatory status.  4.  Essential hypertension. -We will continue her Coreg and Cardizem CD.  5.  Overactive bladder. -We will  continue Ditropan XL.  6.  Type 2 diabetes mellitus. -The patient will be placed on supplement coverage with NovoLog   DVT prophylaxis: Lovenox. Code Status: full code. Family Communication:  The plan of care was discussed in details with the patient (and family). I answered all questions. The patient agreed to proceed with the above mentioned plan. Further management will depend upon hospital course. Disposition Plan: Back to previous home environment Consults called: none. All the records are reviewed and case discussed with ED provider.  Status is: Inpatient  Remains inpatient appropriate because:Ongoing active pain requiring inpatient pain management, Ongoing diagnostic testing needed not appropriate for outpatient work up, Unsafe d/c plan, IV treatments appropriate due to intensity of illness or inability to take PO and Inpatient level of care appropriate due to severity of illness   Dispo: The patient is from: Home              Anticipated d/c is to: Home              Patient currently is not medically stable to d/c.   Difficult to place patient No   TOTAL TIME TAKING CARE OF THIS PATIENT: 55 minutes.    Hannah BeatJan A Mansy M.D on 06/02/2020 at 12:06 AM  [facility]   From 7 PM-7 AM, contact night-coverage [email or web address]  CC: Primary care physician; Lauro RegulusAnderson, Marshall W, MD

## 2020-06-02 NOTE — ED Notes (Addendum)
Medication Reconciliation Report  For Home History Technicians  HIGHLIGHTS:  1. The patient WAS NOT personally interviewed 2. If not, what was the main source used: PHARMACY RECORDS 3. Does the patient appear to take any anti-coagulation agents (e.g. warfarin, Eliquis or Xarelto): UNKNOWN 4. Does the patient appear to take any anti-convulsant agents (e.g. divalproex, levetiracetam or phenytoin): UNKNOWN 5. Does the patient appear to use any insulin products (e.g. Lantus, Novolin or Humalog): UNKNOWN 6. Does the patient appear to take any "beta-blockers" (e.g. metoprolol, carvedilol or bisoprolol: UNKNOWN  BARRIERS:  1. Were there any barriers that prevented or complicated the medication reconciliation process: YES 2. If yes, what was the primary barrier encountered: Condition prevents interview 3. Does the patient appear compliant with prescribed medications: UNABLE TO DETERMINE 4. Does the patient express any barriers with compliance: UNABLE TO DETERMINE 5. What is the primary barrier the patient reports: None   NOTES:[Include any concerns, remarks or complaints the patient expresses regarding medication therapy. Any observations or other information that might be useful to the treatment team can also be included. Immediate needs or concerns should be referred to the RN or appropriate member of the treatment team.]  Patient was not interviewed secondary to AMS and sleeping. Pharmacy records indicate only OXYBUTYNIN filled in last 6 months (01/2020 for 90 days). No recent chart notes available in [facility] (last 05/2019). No return phone call from patient's emergency contact after leaving VM.    UPDATE 06/03/2020: Patient's daughter returned call this AM and reports DILTIAZEM, DULOXETINE and TIMOLOL. However, no recent pharmacy activity suggests patient has likely been noncompliant. Daughter reports TIMOLOL has been out of refills for some time and asks if a new RX could be issued upon patient's  discharge. New prescriptions for other home medications might be beneficial to promote compliance (if deemed appropriate).   Elmo Putt, CPhT [facility] [address] [place], [place] [address] [phone]  ** The above is intended solely for informational and/or communicative purposes. It should in no way be considered an endorsement of any specific treatment, therapy or action. **

## 2020-06-02 NOTE — Progress Notes (Signed)
Progress Note    Kristin AFZAL  Browning:[id number] DOB: [birth year]  DOA: 06/01/2020 PCP: Lauro Regulus, MD    Brief Narrative:     Medical records reviewed and are as summarized below:  Kristin Browning is an 87 y.o. female  From home s/p fall.  She had potentially been on the floor for several hours before neighbor came and saw and called EMS.  She lives home alone and her daughter occasionally brings her meals (daughter ill with MS)  Assessment/Plan:   Active Problems:   AKI (acute kidney injury) (HCC)   DVT (deep venous thrombosis) (HCC)   Rhabdomyolysis   Acute kidney injury likely prerenal secondary to volume depletion and dehydration with associated altered mental status like secondary to metabolic encephalopathy. -IVF -daily labs-- trending down   Fall with subsequent mild acute rhabdomyolysis. -We will follow daily CK level with hydration with IV normal saline. -PT consult will be obtained.   Left lower extremity DVT. -We will continue her on IV heparin drip. -This could be secondary to nonambulatory status.-- concern as she states she has been falling -TOC consult for eliquis vs xarelto   Essential hypertension. - continue her Coreg and Cardizem CD-- although not clear she was taking at home.  Overactive bladder. -hold home med  Type 2 diabetes mellitus. -SSI   Family Communication/Anticipated D/C date and plan/Code Status   DVT prophylaxis: heparin gtt Code Status: Full Code.  Disposition Plan: Status is: Inpatient  Remains inpatient appropriate because:Inpatient level of care appropriate due to severity of illness   Dispo: The patient is from: Home              Anticipated d/c is to: Home              Patient currently is not medically stable to d/c.           Medical Consultants:    None.     Subjective:   Says she has been falling recently  Objective:    Vitals:   06/02/20 0930 06/02/20 1000 06/02/20 1030 06/02/20 1228   BP: 111/64 125/80 98/65 134/74  Pulse: 82  81 93  Resp: (!) 22  (!) 21 16  Temp:    98.3 F (36.8 C)  TempSrc:    Oral  SpO2: 97%  98% 100%  Weight:      Height:        Intake/Output Summary (Last 24 hours) at 06/02/2020 1259 Last data filed at 06/02/2020 1054 Gross per 24 hour  Intake 187.54 ml  Output -  Net 187.54 ml   Filed Weights   06/01/20 2113  Weight: 58.3 kg    Exam:  General: Appearance:   elderly female  With bruising on left forehead     Lungs:      respirations unlabored  Heart:    Normal heart rate. Normal rhythm. No murmurs, rubs, or gallops.   MS:   All extremities are intact.   Neurologic:   Awake, alert, pleasant and cooperative    Data Reviewed:   I have personally reviewed following labs and imaging studies:  Labs: Labs show the following:   Basic Metabolic Panel: Recent Labs  Lab 06/01/20 2116 06/02/20 0656  NA 140 142  K 3.6 4.9  CL 103 108  CO2 24 25  GLUCOSE 128* 97  BUN 48* 47*  CREATININE 1.40* 1.28*  CALCIUM 9.8 9.2  MG 2.7*  --    GFR Estimated Creatinine  Clearance: 26.2 mL/min (A) (by C-G formula based on SCr of 1.28 mg/dL (H)). Liver Function Tests: Recent Labs  Lab 06/01/20 2116  AST 62*  ALT 19  ALKPHOS 53  BILITOT 1.2  PROT 7.2  ALBUMIN 4.1   No results for input(s): LIPASE, AMYLASE in the last 168 hours. No results for input(s): AMMONIA in the last 168 hours. Coagulation profile Recent Labs  Lab 06/02/20 0100  INR 1.2    CBC: Recent Labs  Lab 06/01/20 2116 06/02/20 0656  WBC 16.4* 16.8*  NEUTROABS 13.3*  --   HGB 14.4 13.4  HCT 42.9 40.1  MCV 98.2 100.0  PLT 185 154   Cardiac Enzymes: Recent Labs  Lab 06/01/20 2116 06/02/20 0656  CKTOTAL 1,079* 701*   BNP (last 3 results) No results for input(s): PROBNP in the last 8760 hours. CBG: Recent Labs  Lab 06/02/20 0833  GLUCAP 95   D-Dimer: No results for input(s): DDIMER in the last 72 hours. Hgb A1c: Recent Labs    06/02/20 0656   HGBA1C 5.3   Lipid Profile: No results for input(s): CHOL, HDL, LDLCALC, TRIG, CHOLHDL, LDLDIRECT in the last 72 hours. Thyroid function studies: No results for input(s): TSH, T4TOTAL, T3FREE, THYROIDAB in the last 72 hours.  Invalid input(s): FREET3 Anemia work up: No results for input(s): VITAMINB12, FOLATE, FERRITIN, TIBC, IRON, RETICCTPCT in the last 72 hours. Sepsis Labs: Recent Labs  Lab 06/01/20 2116 06/02/20 0656  WBC 16.4* 16.8*    Microbiology Recent Results (from the past 240 hour(s))  Resp Panel by RT-PCR (Flu A&B, Covid) Nasopharyngeal Swab     Status: None   Collection Time: 06/01/20 11:35 PM   Specimen: Nasopharyngeal Swab; Nasopharyngeal(NP) swabs in vial transport medium  Result Value Ref Range Status   SARS Coronavirus 2 by RT PCR NEGATIVE NEGATIVE Final    Comment: (NOTE) SARS-CoV-2 target nucleic acids are NOT DETECTED.  The SARS-CoV-2 RNA is generally detectable in upper respiratory specimens during the acute phase of infection. The lowest concentration of SARS-CoV-2 viral copies this assay can detect is 138 copies/mL. A negative result does not preclude SARS-Cov-2 infection and should not be used as the sole basis for treatment or other patient management decisions. A negative result may occur with  improper specimen collection/handling, submission of specimen other than nasopharyngeal swab, presence of viral mutation(s) within the areas targeted by this assay, and inadequate number of viral copies(<138 copies/mL). A negative result must be combined with clinical observations, patient history, and epidemiological information. The expected result is Negative.  Fact Sheet for Patients:  BloggerCourse.comhttps://www.fda.gov/media/152166/download  Fact Sheet for Healthcare Providers:  SeriousBroker.ithttps://www.fda.gov/media/152162/download  This test is no t yet approved or cleared by the [place] FDA and  has been authorized for detection and/or diagnosis of SARS-CoV-2  by FDA under an Emergency Use Authorization (EUA). This EUA will remain  in effect (meaning this test can be used) for the duration of the COVID-19 declaration under Section 564(b)(1) of the Act, 21 U.S.C.section 360bbb-3(b)(1), unless the authorization is terminated  or revoked sooner.       Influenza A by PCR NEGATIVE NEGATIVE Final   Influenza B by PCR NEGATIVE NEGATIVE Final    Comment: (NOTE) The Xpert Xpress SARS-CoV-2/FLU/RSV plus assay is intended as an aid in the diagnosis of influenza from Nasopharyngeal swab specimens and should not be used as a sole basis for treatment. Nasal washings and aspirates are unacceptable for Xpert Xpress SARS-CoV-2/FLU/RSV testing.  Fact Sheet for Patients: BloggerCourse.comhttps://www.fda.gov/media/152166/download  Fact  Sheet for Healthcare Providers: SeriousBroker.it  This test is not yet approved or cleared by the [place] and has been authorized for detection and/or diagnosis of SARS-CoV-2 by FDA under an Emergency Use Authorization (EUA). This EUA will remain in effect (meaning this test can be used) for the duration of the COVID-19 declaration under Section 564(b)(1) of the Act, 21 U.S.C. section 360bbb-3(b)(1), unless the authorization is terminated or revoked.  Performed at [facility], [address] [place], [place] [address]     Procedures and diagnostic studies:  DG Pelvis 1-2 Views  Result Date: 06/01/2020 CLINICAL DATA:  Weakness, fall, right hip pain EXAM: PELVIS - 1-2 VIEW COMPARISON:  None. FINDINGS: Mild patient rotation. No evidence of acute fracture. Both femoral heads are seated in the acetabulum. Pubic rami are grossly intact. Degenerative change of the pubic symphysis which remains congruent. The bones are diffusely under mineralized. There are vascular calcifications. IMPRESSION: No evidence of acute fracture of the pelvis. Electronically Signed   By: Narda Rutherford M.D.   On:  06/01/2020 22:52   CT Head Wo Contrast  Result Date: 06/01/2020 CLINICAL DATA:  Unwitnessed fall. EXAM: CT HEAD WITHOUT CONTRAST TECHNIQUE: Contiguous axial images were obtained from the base of the skull through the vertex without intravenous contrast. COMPARISON:  None. FINDINGS: Brain: No intracranial hemorrhage, mass effect, or midline shift. Generalized atrophy, normal for age. No hydrocephalus. The basilar cisterns are patent. Moderate periventricular and deep white matter hypodensity typical of chronic small vessel ischemia. Suspect small periventricular lacunar infarct in the left frontal lobe. No evidence of territorial infarct or acute ischemia. No extra-axial or intracranial fluid collection. Small incidental falx lipoma. Vascular: Atherosclerosis of skullbase vasculature without hyperdense vessel or abnormal calcification. Skull: No fracture or focal lesion. Sinuses/Orbits: Paranasal sinuses and mastoid air cells are clear. The visualized orbits are unremarkable. Bilateral cataract resection. Other: Small left frontal scalp contusion. IMPRESSION: 1. Small left frontal scalp contusion. No acute intracranial abnormality. No skull fracture. 2. Age related atrophy and chronic small vessel ischemia. Electronically Signed   By: Narda Rutherford M.D.   On: 06/01/2020 21:37   CT Cervical Spine Wo Contrast  Result Date: 06/01/2020 CLINICAL DATA:  Neck trauma (Age >= 65y) Unwitnessed fall. EXAM: CT CERVICAL SPINE WITHOUT CONTRAST TECHNIQUE: Multidetector CT imaging of the cervical spine was performed without intravenous contrast. Multiplanar CT image reconstructions were also generated. COMPARISON:  None. FINDINGS: Alignment: Trace anterolisthesis of C7 on T1, likely degenerative. No traumatic subluxation. Skull base and vertebrae: No acute fracture. Vertebral body heights are maintained. The dens and skull base are intact. Partial ankylosis of left C2-C3 facet is likely degenerative. Soft tissues and  spinal canal: No prevertebral fluid or swelling. No visible canal hematoma. Disc levels: Diffuse disc space narrowing and endplate spurring. Mild multilevel facet hypertrophy. No high-grade canal stenosis. Upper chest: No acute or unexpected findings. Other: None. IMPRESSION: Degenerative change in the cervical spine without acute fracture or subluxation. Electronically Signed   By: Narda Rutherford M.D.   On: 06/01/2020 21:40   US Venous Img Lower Unilateral Left  Addendum Date: 06/01/2020   ADDENDUM REPORT: 06/01/2020 22:58 ADDENDUM: It should be noted that the previously described areas of LEFT lower extremity DVT are nonocclusive in nature. Electronically Signed   By: Aram Candela M.D.   On: 06/01/2020 22:58   Result Date: 06/01/2020 CLINICAL DATA:  Left lower extremity swelling. EXAM: LEFT LOWER EXTREMITY VENOUS DOPPLER ULTRASOUND TECHNIQUE: Gray-scale sonography with compression, as well  as color and duplex ultrasound, were performed to evaluate the deep venous system(s) from the level of the common femoral vein through the popliteal and proximal calf veins. COMPARISON:  None. FINDINGS: VENOUS There is incomplete compressibility of the LEFT common femoral vein, proximal LEFT saphenous vein and proximal LEFT superficial femoral vein. There is normal compressibility of the mid and distal LEFT superficial femoral, and LEFT popliteal veins, as well as the visualized LEFT calf veins. Echogenic filling defects are also seen within the LEFT common femoral vein, proximal LEFT saphenous vein and proximal LEFT superficial femoral vein on grayscale or color Doppler imaging. Doppler waveforms show normal direction of venous flow and normal respiratory plasticity. Limited views of the contralateral common femoral vein are unremarkable. OTHER None. Limitations: none IMPRESSION: Findings consistent with DVT within the LEFT common femoral vein, proximal LEFT saphenous vein and proximal LEFT superficial femoral  vein. Electronically Signed: By: Aram Candela M.D. On: 06/01/2020 22:49   DG Chest Portable 1 View  Result Date: 06/01/2020 CLINICAL DATA:  Weakness, fall, right hip pain.  Unwitnessed fall. EXAM: PORTABLE CHEST 1 VIEW COMPARISON:  None. FINDINGS: Significant patient rotation. The heart is normal in size. Aortic atherosclerosis. No evidence of focal airspace disease, pleural effusion, or pneumothorax. No pulmonary edema. Degenerative change of the left shoulder. Scoliotic curvature of the spine. No acute osseous abnormalities are seen. IMPRESSION: Rotated exam without evidence of acute traumatic injury to the chest. Electronically Signed   By: Narda Rutherford M.D.   On: 06/01/2020 22:51    Medications:   . insulin aspart  0-9 Units Subcutaneous TID PC & HS   Continuous Infusions: . sodium chloride    . heparin       LOS: 1 day   Joseph Art  [facility]   How to contact the [facility] Attending or Consulting provider 7A - 7P or covering provider during after hours 7P -7A, for this patient?  1. Check the care team in [facility] and look for a) attending/consulting [facility] provider listed and b) the [facility] team listed 2. Log into [email or web address] and use [place]'s universal password to access. If you do not have the password, please contact the hospital operator. 3. Locate the [facility] provider you are looking for under [facility] and page to a number that you can be directly reached. 4. If you still have difficulty reaching the provider, please page the [facility] (Director on Call) for the Hospitalists listed on amion for assistance.  06/02/2020, 12:59 PM

## 2020-06-03 LAB — BASIC METABOLIC PANEL
Anion gap: 5 (ref 5–15)
BUN: 44 mg/dL — ABNORMAL HIGH (ref 8–23)
CO2: 23 mmol/L (ref 22–32)
Calcium: 8.6 mg/dL — ABNORMAL LOW (ref 8.9–10.3)
Chloride: 112 mmol/L — ABNORMAL HIGH (ref 98–111)
Creatinine, Ser: 1.03 mg/dL — ABNORMAL HIGH (ref 0.44–1.00)
GFR, Estimated: 52 mL/min — ABNORMAL LOW (ref >60)
Glucose, Bld: 93 mg/dL (ref 70–99)
Potassium: 4.8 mmol/L (ref 3.5–5.1)
Sodium: 140 mmol/L (ref 135–145)

## 2020-06-03 LAB — CBC
HCT: 36.9 % (ref 36.0–46.0)
Hemoglobin: 12.1 g/dL (ref 12.0–15.0)
MCH: 33.2 pg (ref 26.0–34.0)
MCHC: 32.8 g/dL (ref 30.0–36.0)
MCV: 101.1 fL — ABNORMAL HIGH (ref 80.0–100.0)
Platelets: 135 10*3/uL — ABNORMAL LOW (ref 150–400)
RBC: 3.65 MIL/uL — ABNORMAL LOW (ref 3.87–5.11)
RDW: 14.7 % (ref 11.5–15.5)
WBC: 12.7 10*3/uL — ABNORMAL HIGH (ref 4.0–10.5)
nRBC: 0 % (ref 0.0–0.2)

## 2020-06-03 LAB — IRON AND TIBC
Iron: 80 ug/dL (ref 28–170)
Saturation Ratios: 46 % — ABNORMAL HIGH (ref 10.4–31.8)
TIBC: 174 ug/dL — ABNORMAL LOW (ref 250–450)
UIBC: 94 ug/dL

## 2020-06-03 LAB — GLUCOSE, CAPILLARY
Glucose-Capillary: 89 mg/dL (ref 70–99)
Glucose-Capillary: 88 mg/dL (ref 70–99)
Glucose-Capillary: 93 mg/dL (ref 70–99)
Glucose-Capillary: 92 mg/dL (ref 70–99)

## 2020-06-03 LAB — CK: Total CK: 300 U/L — ABNORMAL HIGH (ref 38–234)

## 2020-06-03 LAB — FOLATE: Folate: 6.1 ng/mL (ref >5.9)

## 2020-06-03 LAB — VITAMIN B12: Vitamin B-12: 769 pg/mL (ref 180–914)

## 2020-06-03 LAB — HEPARIN LEVEL (UNFRACTIONATED): Heparin Unfractionated: 0.5 IU/mL (ref 0.30–0.70)

## 2020-06-03 LAB — VITAMIN D 25 HYDROXY (VIT D DEFICIENCY, FRACTURES): Vit D, 25-Hydroxy: 22.5 ng/mL — ABNORMAL LOW (ref 30–100)

## 2020-06-03 LAB — TSH: TSH: 4.297 u[IU]/mL (ref 0.350–4.500)

## 2020-06-03 MED ORDER — HYDRALAZINE HCL 20 MG/ML IJ SOLN
10.0000 mg | Freq: Four times a day (QID) | INTRAMUSCULAR | Status: DC | PRN
  Administered 2020-06-03: 10 mg via INTRAVENOUS
  Filled 2020-06-03: qty 1

## 2020-06-03 MED ORDER — VITAMIN D (ERGOCALCIFEROL) 1.25 MG (50000 UNIT) PO CAPS
50000.0000 [IU] | ORAL_CAPSULE | ORAL | Status: DC
Start: 2020-06-03 — End: 2020-06-05
  Administered 2020-06-03: 50000 [IU] via ORAL
  Filled 2020-06-03: qty 1

## 2020-06-03 MED ORDER — APIXABAN 5 MG PO TABS
10.0000 mg | ORAL_TABLET | Freq: Two times a day (BID) | ORAL | Status: DC
  Administered 2020-06-03 – 2020-06-05 (×4): 10 mg via ORAL
  Filled 2020-06-03 (×4): qty 2

## 2020-06-03 MED ORDER — APIXABAN 5 MG PO TABS: 5.0000 mg | ORAL_TABLET | Freq: Two times a day (BID) | ORAL | Status: DC

## 2020-06-03 NOTE — Progress Notes (Signed)
PT Cancellation Note  Patient Details Name: Kristin Browning MRN: [id number] DOB: 31-Jul-1929   Cancelled Treatment:     Pt chart reviewed this AM.  Per heparin administered protocol,  will hold this AM and plan to see pt after 11:51 AM for PT evaluation.   Nolon Bussing, PT, DPT 06/03/20, 10:27 AM

## 2020-06-03 NOTE — Evaluation (Signed)
Physical Therapy Evaluation Patient Details Name: Kristin Browning MRN: [id number] DOB: 10-23-1929 Today's Date: 06/03/2020   History of Present Illness  Kristin Browning is an 87 y.o. female  From home s/p fall.  She had potentially been on the floor for several hours before neighbor came and saw and called EMS.  She lives home alone and her daughter occasionally brings her meals (daughter ill with MS).     Clinical Impression  Pt received in Semi-Fowler's position and agreeable to therapy.  Pt able to follow commands and perform bed-level exercises with good technique.  Pt did have difficulty with SLR and Hip abduction due to weakness in her LEs, requiring active assistance from therapist.  Pt noted that she had soiled her bed, and was requesting nursing to assist.  Pt was able to transfer to seated position with use of verbal and tactile cuing for hand placement.  Nursing staff assisted with bed change once pt was upright in standing position.  Pt had soiled the bed and was assisted in transferring to the [facility] while bedding was changed.  Pt assisted in performing self-care before requiring therapist and nursing assistance for pericare.  Pt then transferred back to bed with all needs met and nursing in room.    Nurse technician notified of skin lesion on the back and her toenails are causing red spots on her foot as well.  Attending MD notified via secure chat of lesions and podiatry consult requested.       Follow Up Recommendations SNF;Supervision/Assistance - 24 hour    Equipment Recommendations   (Determine at next venue of care.)    Recommendations for Other Services       Precautions / Restrictions Precautions Precautions: None Restrictions Weight Bearing Restrictions: No      Mobility  Bed Mobility Overal bed mobility: Needs Assistance Bed Mobility: Supine to Sit     Supine to sit: Mod assist     General bed mobility comments: Pt requires verbal, tactile cuing for hand placement  and requires modA from therapist to come upright with use of UEs.    Transfers Overall transfer level: Needs assistance   Transfers: Sit to/from Stand Sit to Stand: Min assist         General transfer comment: Pt requires minA from therapist for standing upright and elevated surface.  Ambulation/Gait Ambulation/Gait assistance: Min guard Gait Distance (Feet): 4 Feet Assistive device: Rolling walker (2 wheeled) Gait Pattern/deviations: Step-to pattern;Decreased stride length Gait velocity: decrease   General Gait Details: Pt able to ambulate to the [facility] with use of FWW and CGA from therapist for safety and nagivation of lines/leads.  Stairs            Wheelchair Mobility    Modified Rankin (Stroke Patients Only)       Balance Overall balance assessment: Needs assistance Sitting-balance support: Feet supported Sitting balance-Leahy Scale: Fair Sitting balance - Comments: Pt has severe kyphosis of the back that keeps her in a forward lean in sitting.   Standing balance support: Bilateral upper extremity supported Standing balance-Leahy Scale: Poor Standing balance comment: Pt requires assistance through UE support and FWW to remain upright.                             Pertinent Vitals/Pain Pain Assessment: Faces Faces Pain Scale: Hurts little more Pain Location: Bilateral soreness of feet. Pain Descriptors / Indicators: Sore Pain Intervention(s): Limited activity within patient's  tolerance;Monitored during session;Repositioned    Home Living Family/patient expects to be discharged to:: Private residence Living Arrangements: Alone Available Help at Discharge: Family (Daughter is at home, but has MS so is unable to greatly assist.) Type of Home: House Home Access: Stairs to enter Entrance Stairs-Rails: Right Entrance Stairs-Number of Steps: [place]: Two level;Able to live on main level with bedroom/bathroom (Stays downstairs.) Home  Equipment: Grab bars - toilet;Walker - 4 wheels;Bedside commode      Prior Function Level of Independence: Independent with assistive device(s)               Hand Dominance   Dominant Hand: Right    Extremity/Trunk Assessment                Communication   Communication: No difficulties  Cognition Arousal/Alertness: Awake/alert Behavior During Therapy: WFL for tasks assessed/performed Overall Cognitive Status: Within Functional Limits for tasks assessed                                        General Comments      Exercises Total Joint Exercises Ankle Circles/Pumps: AROM;Strengthening;Both;10 reps;Supine Quad Sets: AROM;Strengthening;Both;10 reps;Supine Gluteal Sets: AROM;Strengthening;Both;10 reps;Supine Heel Slides: AROM;Strengthening;Both;10 reps;Supine Hip ABduction/ADduction: AAROM;Strengthening;Both;10 reps;Supine Straight Leg Raises: AAROM;Strengthening;Both;10 reps;Supine Other Exercises Other Exercises: Pt educated on roles of PT and services provided during hospital stay.  Pt also educated on physiological benefits of exercising during hospital stay.   Assessment/Plan    PT Assessment Patient needs continued PT services  PT Problem List Decreased strength;Decreased range of motion;Decreased activity tolerance;Decreased balance;Decreased mobility;Decreased knowledge of use of DME;Decreased safety awareness       PT Treatment Interventions DME instruction;Gait training;Stair training;Functional mobility training;Therapeutic activities;Therapeutic exercise;Balance training    PT Goals (Current goals can be found in the Care Plan section)  Acute Rehab PT Goals Patient Stated Goal: To get stronger and go home. PT Goal Formulation: With patient Time For Goal Achievement: 06/17/20 Potential to Achieve Goals: Poor    Frequency Min 2X/week   Barriers to discharge Decreased caregiver support Daughter lives at home with her, but has MS  and is unable to fully provide for pt.    Co-evaluation               AM-PAC PT "6 Clicks" Mobility  Outcome Measure Help needed turning from your back to your side while in a flat bed without using bedrails?: A Lot Help needed moving from lying on your back to sitting on the side of a flat bed without using bedrails?: A Lot Help needed moving to and from a bed to a chair (including a wheelchair)?: A Lot Help needed standing up from a chair using your arms (e.g., wheelchair or bedside chair)?: A Little Help needed to walk in hospital room?: A Lot Help needed climbing 3-5 steps with a railing? : A Lot 6 Click Score: 13    End of Session   Activity Tolerance: Patient limited by fatigue Patient left: in bed;with call bell/phone within reach;with nursing/sitter in room Nurse Communication: Mobility status PT Visit Diagnosis: Unsteadiness on feet (R26.81);Other abnormalities of gait and mobility (R26.89);Repeated falls (R29.6);Muscle weakness (generalized) (M62.81);History of falling (Z91.81);Difficulty in walking, not elsewhere classified (R26.2);Pain Pain - part of body: Ankle and joints of foot    Time: 2952-8413 PT Time Calculation (min) (ACUTE ONLY): 65 min   Charges:   PT Evaluation $PT  Eval Low Complexity: 1 Low PT Treatments $Gait Training: 8-22 mins $Therapeutic Exercise: 23-37 mins $Self Care/Home Management: 8-22        Gwenlyn Saran, PT, DPT 06/03/20, 4:18 PM

## 2020-06-03 NOTE — [place] FL2 (Signed)
[place] MEDICAID FL2 LEVEL OF CARE SCREENING TOOL     IDENTIFICATION  Patient Name: Kristin Browning Birthdate: 06-27-1929 Sex: female Admission Date (Current Location): 06/01/2020  [place] and [place] Number:  Chiropodist and Address:  [facility], [address] [place], [place] [address]      Provider Number: [id number]  Attending Physician Name and Address:  Gillis Santa, MD  Relative Name and Phone Number:       Current Level of Care: Hospital Recommended Level of Care: Skilled Nursing Facility Prior Approval Number:    Date Approved/Denied:   PASRR Number: [id number]  Discharge Plan: SNF    Current Diagnoses: Patient Active Problem List   Diagnosis Date Noted  . DVT (deep venous thrombosis) (HCC) 06/02/2020  . Rhabdomyolysis 06/02/2020  . AKI (acute kidney injury) (HCC) 06/01/2020    Orientation RESPIRATION BLADDER Height & Weight     Self,Time,Place  Normal Incontinent,External catheter Weight: 128 lb 8.5 oz (58.3 kg) Height:  5\' 4"  (162.6 cm)  BEHAVIORAL SYMPTOMS/MOOD NEUROLOGICAL BOWEL NUTRITION STATUS   (None)  (None) Incontinent Diet (Carb modified)  AMBULATORY STATUS COMMUNICATION OF NEEDS Skin   Limited Assist Verbally Bruising                       Personal Care Assistance Level of Assistance  Bathing,Feeding,Dressing Bathing Assistance: Limited assistance Feeding assistance: Limited assistance Dressing Assistance: Limited assistance     Functional Limitations Info  Sight,Hearing,Speech Sight Info: Adequate Hearing Info: Adequate Speech Info: Adequate    SPECIAL CARE FACTORS FREQUENCY  PT (By licensed PT),OT (By licensed OT)     PT Frequency: 5 x week OT Frequency: 5 x week            Contractures Contractures Info: Not present    Additional Factors Info  Code Status,Allergies Code Status Info: Full code Allergies Info: Not on file           Current Medications (06/03/2020):  This is  the current hospital active medication list Current Facility-Administered Medications  Medication Dose Route Frequency Provider Last Rate Last Admin  . 0.9 %  sodium chloride infusion   Intravenous Continuous 06/05/2020, DO 75 mL/hr at 06/03/20 06/05/20 Infusion Verify at 06/03/20 0837  . acetaminophen (TYLENOL) suppository 650 mg  650 mg Rectal Q6H PRN Mansy, Jan A, MD       Or  . acetaminophen (TYLENOL) tablet 650 mg  650 mg Oral Q6H PRN 06/05/20, RPH      . apixaban (ELIQUIS) tablet 10 mg  10 mg Oral BID Ronnald Ramp, MD       Followed by  . [START ON 06/10/2020] apixaban (ELIQUIS) tablet 5 mg  5 mg Oral BID 06/12/2020, MD      . heparin ADULT infusion 100 units/mL (25000 units/226mL)  750 Units/hr Intravenous Continuous 45m, MD 7.5 mL/hr at 06/02/20 2136 750 Units/hr at 06/02/20 2136  . hydrALAZINE (APRESOLINE) injection 10 mg  10 mg Intravenous Q6H PRN 2137, MD   10 mg at 06/03/20 06/05/20  . influenza vaccine adjuvanted (FLUAD) injection 0.5 mL  0.5 mL Intramuscular Tomorrow-1000 Vann, Jessica U, DO      . insulin aspart (novoLOG) injection 0-9 Units  0-9 Units Subcutaneous TID PC & HS Mansy, Jan A, MD      . magnesium hydroxide (MILK OF MAGNESIA) suspension 30 mL  30 mL Oral Daily PRN Mansy, Feb, MD      .  morphine 2 MG/ML injection 1 mg  1 mg Intravenous Q4H PRN Joseph Art, DO      . ondansetron (ZOFRAN) tablet 4 mg  4 mg Oral Q6H PRN Mansy, Jan A, MD       Or  . ondansetron [facility]) injection 4 mg  4 mg Intravenous Q6H PRN Mansy, Jan A, MD      . traZODone (DESYREL) tablet 25 mg  25 mg Oral QHS PRN Mansy, Vernetta Honey, MD      . Vitamin D (Ergocalciferol) (DRISDOL) capsule 50,000 Units  50,000 Units Oral Q7 days Gillis Santa, MD         Discharge Medications: Please see discharge summary for a list of discharge medications.  Relevant Imaging Results:  Relevant Lab Results:   Additional Information SS#: [id number]  Margarito Liner, LCSW

## 2020-06-03 NOTE — Progress Notes (Signed)
[facility] Progress Note  Patient: Kristin Browning    OEU:[id number]  DOA: 06/01/2020     Date of Service: the patient was seen and examined on 06/03/2020  Chief Complaint  Patient presents with  . Fall   Brief hospital course: Kristin Browning is a 87 y.o. Caucasian female with medical history significant for hypertension, hypertensive cardiomyopathy, SVT, type 2 diabetes mellitus with stage III chronic kidney disease, dyslipidemia and urinary incontinence who presented to the emergency room after having an unwitnessed fall at home where she was apparently on the ground for a few hours before her neighbor came and saw her then called EMS.  She was on the ground for about an hour and a half per her daughter a left it at 6 PM and came back at 7:30 PM. She denies any presyncope or syncope however she said to EMS that she may have passed out.  She is not sure if she remembers everything however is able to tell me that she was getting wood to heat her house when she lost balance and fell.  Sustaining a left frontal hematoma.  She usually ambulates with a walker.  She was incontinent.  Per EMS her blood pressure was elevated at 181/109 with a heart rate of 104 with normal pulse oximetry.  She was having right hip pain.  She denied any left leg pain until she fell.  No recent travels or surgeries.  ED Course: Presented to the ER blood pressure was 168/99 with heart rate of 107 with otherwise normal vital signs.  Labs revealed potassium of 3.6 and a glucose of 128, BUN of 48 and creatinine 1.4 magnesium 2.7.  CK was 1079 CBC showed leukocytosis 16.4 with neutrophilia.  Influenza COVID-19 PCR came back negative. EKG as reviewed by me : EKG showed sinus tachycardia with rate of 104 with left bundle branch block. Imaging: Chest x-ray showed renal beginnings a.m. without evidence of acute traumatic injury.  It showed  iatrogenic changes in the left shoulder and scoliosis of the spine..  Right hip x-ray showed no  fracture. Left lower extremity venous Doppler was positive for DVT involving the left common femoral vein, proximal left saphenous vein proximal left superficial femoral vein and DVT was nonocclusive.  The patient was given 500 mL IV normal saline bolus and was started on IV heparin bolus followed by drip.  She will be admitted to a medically monitored bed for further evaluation and management.    Assessment and Plan:  Principal problem Acute left lower extremity DVT  Active Problems:   AKI (acute kidney injury) (HCC)  #  Acute kidney injury likely prerenal secondary to volume depletion and dehydration with associated altered mental status like secondary to metabolic encephalopathy. AKI resolved status post IV fluid given for hydration We will follow BMPs. We will hold off diuretics and nephrotoxins.  # Fall with subsequent acute rhabdomyolysis. -CK 300--- Vitamin D insufficiency, vitamin D level 22.5, vitamin B12 levels within normal range TSH within normal range Continue to trend CK level Continue gentle hydration PT consult rec SNF placement -We will hold off her statin therapy. -We will hold off her Pamelor.  #  Left lower extremity DVT. -s/p IV heparin drip, Eliquis milligram p.o. twice daily for 7 days and then 5 mg p.o. twice daily, start on 06/03/2020 p.m. dose -This could be secondary to nonambulatory status. Patient remains at high risk for bleeding due to anticoagulants and risk of frequent falls.  We will discuss  with the family regarding goals of care and further management.  #  Essential hypertension. - continue her Coreg and Cardizem CD.  #  Overactive bladder. - continue Ditropan XL.  #  Type 2 diabetes mellitus. -The patient will be placed on supplement coverage with NovoLog   Body mass index is 22.06 kg/m.        Diet: Carb modified diet DVT Prophylaxis: Subcutaneous Heparin  Transition to Eliquis tonight  Advance goals of care discussion:  Full code  Family Communication: family was NOT present at bedside, at the time of interview.  The pt provided permission to discuss medical plan with the family. Opportunity was given to ask question and all questions were answered satisfactorily.   Disposition:  Pt is from home, admitted with frequent falls and DVT, still has DVT, debility prone to fall, which precludes a safe discharge. Discharge to SNF, most likely in 1 to 2 days.  Subjective: No overnight issues, patient denies any abdominal pain, no nausea vomiting diarrhea, no chest pain or shortness of breath, no palpitations.  Physical Exam: General:  alert oriented to time, place, and person.  Appear in mild distress, affect appropriate Eyes: PERRLA ENT: Oral Mucosa Clear, moist  Neck: no JVD,  Cardiovascular: S1 and S2 Present, no Murmur,  Respiratory: good respiratory effort, Bilateral Air entry equal and Decreased, no Crackles, no wheezes Abdomen: Bowel Sound present, Soft and no tenderness,  Skin: no rashes Extremities: 2+ Pedal edema, no calf tenderness Neurologic: without any new focal findings Gait not checked due to patient safety concerns  Vitals:   06/02/20 2326 06/03/20 0453 06/03/20 0814 06/03/20 1152  BP: (!) 151/85 (!) 155/93 (!) 181/107 (!) 159/89  Pulse: 77 86 85 90  Resp: 16 20 18 17   Temp: 98.7 F (37.1 C) 98.3 F (36.8 C) 98.8 F (37.1 C) 98.7 F (37.1 C)  TempSrc:   Oral Oral  SpO2: 97% 98% 97% 97%  Weight:      Height:        Intake/Output Summary (Last 24 hours) at 06/03/2020 1500 Last data filed at 06/03/2020 1300 Gross per 24 hour  Intake 1014.45 ml  Output 650 ml  Net 364.45 ml   Filed Weights   06/01/20 2113  Weight: 58.3 kg    Data Reviewed: I have personally reviewed and interpreted daily labs, tele strips, imagings as discussed above. I reviewed all nursing notes, pharmacy notes, vitals, pertinent old records I have discussed plan of care as described above with RN and  patient/family.  CBC: Recent Labs  Lab 06/01/20 2116 06/02/20 0656 06/03/20 0521  WBC 16.4* 16.8* 12.7*  NEUTROABS 13.3*  --   --   HGB 14.4 13.4 12.1  HCT 42.9 40.1 36.9  MCV 98.2 100.0 101.1*  PLT 185 154 135*   Basic Metabolic Panel: Recent Labs  Lab 06/01/20 2116 06/02/20 0656 06/03/20 0521  NA 140 142 140  K 3.6 4.9 4.8  CL 103 108 112*  CO2 24 25 23   GLUCOSE 128* 97 93  BUN 48* 47* 44*  CREATININE 1.40* 1.28* 1.03*  CALCIUM 9.8 9.2 8.6*  MG 2.7*  --   --     Studies: No results found.  Scheduled Meds: . apixaban  10 mg Oral BID   Followed by  . [START ON 06/10/2020] apixaban  5 mg Oral BID  . influenza vaccine adjuvanted  0.5 mL Intramuscular Tomorrow-1000  . insulin aspart  0-9 Units Subcutaneous TID PC & HS   Continuous  Infusions: . sodium chloride 75 mL/hr at 06/03/20 0837  . heparin 750 Units/hr (06/02/20 2136)   PRN Meds: acetaminophen **OR** acetaminophen, hydrALAZINE, magnesium hydroxide, morphine injection, ondansetron **OR** ondansetron (ZOFRAN) IV, traZODone  Time spent: 35 minutes  Author: Gillis Santa. MD [facility] 06/03/2020 3:00 PM  To reach On-call, see care teams to locate the attending and reach out to them via [email or web address]. If 7PM-7AM, please contact night-coverage If you still have difficulty reaching the attending provider, please page the [facility] (Director on Call) for [facility] on amion for assistance.

## 2020-06-03 NOTE — Progress Notes (Signed)
ANTICOAGULATION CONSULT NOTE   Pharmacy Consult for Heparin  Indication: DVT  Patient Measurements: Heparin Dosing Weight:  58.3 kg   Labs: Recent Labs    06/01/20 2116 06/02/20 0100 06/02/20 0656 06/02/20 0953 06/02/20 2000 06/03/20 0521  HGB 14.4  --  13.4  --   --  12.1  HCT 42.9  --  40.1  --   --  36.9  PLT 185  --  154  --   --  135*  APTT  --  27  --   --   --   --   LABPROT  --  15.1  --   --   --   --   INR  --  1.2  --   --   --   --   HEPARINUNFRC  --   --   --  1.02* 0.99* 0.50  CREATININE 1.40*  --  1.28*  --   --  1.03*  CKTOTAL 1,079*  --  701*  --   --  300*    Estimated Creatinine Clearance: 32.6 mL/min (A) (by C-G formula based on SCr of 1.03 mg/dL (H)).   Medical History: History reviewed. No pertinent past medical history.  Medications:  Medications Prior to Admission  Medication Sig Dispense Refill Last Dose  . oxybutynin (DITROPAN-XL) 10 MG 24 hr tablet Take 10 mg by mouth daily.   Unknown at Unknown    Assessment: Pharmacy consulted to dose heparin in this 87 year old female admitted with DVT.  No prior anticoag noted. [place] of LE: Findings consistent with DVT within the LEFT common femoral vein, proximal LEFT saphenous vein and proximal LEFT superficial femoral vein.  3/15 0953 HL 1.02: Hold heparin infusion x 1 hour and restart at 900 units/hr 3/15 2000 HL 0.99: Decrease heparin infusion to 750 units/hr  Goal of Therapy:  Heparin level 0.3-0.7 units/ml Monitor platelets by anticoagulation protocol: Yes   Plan:  3/16:  HL @ 0521 = 0.5 Will continue pt on current rate and draw confirmation level in 8 hrs on 3/16 @ 1300.   Robbins,Jason D 06/03/2020,6:41 AM

## 2020-06-03 NOTE — Discharge Instructions (Signed)
Information on my medicine - ELIQUIS (apixaban)   This medication education was reviewed with me or my healthcare representative as part of my discharge preparation.   WHY WAS ELIQUIS PRESCRIBED FOR YOU?  Eliquis was prescribed to treat blood clots that may have been found in the veins of your legs (deep vein thrombosis) or in your lungs (pulmonary embolism) and to reduce the risk of them occurring again.  WHAT DO YOU NEED TO KNOW ABOUT ELIQUIS ?  The starting dose is 10 mg (two 5 mg tablets) taken TWICE daily for the FIRST SEVEN (7) DAYS,then on 06/10/20 in the evening, the dose is reduced to ONE 5 mg tablet taken TWICE daily. Eliquis may be taken with or without food.Try to take the dose about the same time in the morning and in the evening. If you have difficulty swallowing the tablet whole please discuss with your pharmacist how to take the medication safely.  Take Eliquis exactly as prescribed and DO NOT stop taking Eliquis without talking to the doctor who prescribed the medication. Stopping may increase your risk of developing a new blood clot. Refill your prescription before you run out. After discharge, you should have regular check-up appointments with your healthcare provider that is prescribing your Eliquis.   WHAT DO YOU DO IF YOU MISS A DOSE? If a dose of ELIQUIS is not taken at the scheduled time, take it as soon as possible on the same day and twice-daily administration should be resumed. The dose should not be doubled to make up for a missed dose.  IMPORTANT SAFETY INFORMATION A possible side effect of Eliquis is bleeding. You should call your healthcare provider right away if you experience any of the following:  ?   Bleeding from an injury or your nose that does not stop. ?   Unusual colored urine (red or dark brown) or unusual colored stools (red or black). ?   Unusual bruising for unknown reasons. ?   A serious fall or if you hit your head (even if there is no  bleeding).  Some medicines may interact with Eliquis and might increase your risk of bleeding or clotting while on Eliquis. To help avoid this, consult your healthcare provider or pharmacist prior to using any new prescription or non-prescription medications, including herbals, vitamins, non-steroidal anti-inflammatory drugs (NSAIDs) and supplements.   This website has more information on Eliquis (apixaban): [email or web address].

## 2020-06-04 LAB — CBC
HCT: 33.6 % — ABNORMAL LOW (ref 36.0–46.0)
Hemoglobin: 11 g/dL — ABNORMAL LOW (ref 12.0–15.0)
MCH: 33.3 pg (ref 26.0–34.0)
MCHC: 32.7 g/dL (ref 30.0–36.0)
MCV: 101.8 fL — ABNORMAL HIGH (ref 80.0–100.0)
Platelets: 135 10*3/uL — ABNORMAL LOW (ref 150–400)
RBC: 3.3 MIL/uL — ABNORMAL LOW (ref 3.87–5.11)
RDW: 14.8 % (ref 11.5–15.5)
WBC: 9.3 10*3/uL (ref 4.0–10.5)
nRBC: 0 % (ref 0.0–0.2)

## 2020-06-04 LAB — GLUCOSE, CAPILLARY
Glucose-Capillary: 89 mg/dL (ref 70–99)
Glucose-Capillary: 98 mg/dL (ref 70–99)
Glucose-Capillary: 112 mg/dL — ABNORMAL HIGH (ref 70–99)
Glucose-Capillary: 105 mg/dL — ABNORMAL HIGH (ref 70–99)

## 2020-06-04 LAB — BASIC METABOLIC PANEL
Anion gap: 6 (ref 5–15)
BUN: 35 mg/dL — ABNORMAL HIGH (ref 8–23)
CO2: 21 mmol/L — ABNORMAL LOW (ref 22–32)
Calcium: 8.4 mg/dL — ABNORMAL LOW (ref 8.9–10.3)
Chloride: 114 mmol/L — ABNORMAL HIGH (ref 98–111)
Creatinine, Ser: 0.92 mg/dL (ref 0.44–1.00)
GFR, Estimated: 60 mL/min — ABNORMAL LOW (ref >60)
Glucose, Bld: 85 mg/dL (ref 70–99)
Potassium: 4.2 mmol/L (ref 3.5–5.1)
Sodium: 141 mmol/L (ref 135–145)

## 2020-06-04 LAB — PHOSPHORUS: Phosphorus: 2.3 mg/dL — ABNORMAL LOW (ref 2.5–4.6)

## 2020-06-04 LAB — CK: Total CK: 111 U/L (ref 38–234)

## 2020-06-04 LAB — MAGNESIUM: Magnesium: 2.1 mg/dL (ref 1.7–2.4)

## 2020-06-04 LAB — SARS CORONAVIRUS 2 (TAT 6-24 HRS): SARS Coronavirus 2: NEGATIVE

## 2020-06-04 MED ORDER — BUSPIRONE HCL 15 MG PO TABS
7.5000 mg | ORAL_TABLET | Freq: Three times a day (TID) | ORAL | Status: DC
  Administered 2020-06-04 – 2020-06-05 (×3): 7.5 mg via ORAL
  Filled 2020-06-04 (×6): qty 1

## 2020-06-04 MED ORDER — DILTIAZEM HCL ER COATED BEADS 240 MG PO CP24
240.0000 mg | ORAL_CAPSULE | Freq: Every day | ORAL | Status: DC
  Administered 2020-06-04 – 2020-06-05 (×2): 240 mg via ORAL
  Filled 2020-06-04: qty 2
  Filled 2020-06-04: qty 1
  Filled 2020-06-04: qty 2
  Filled 2020-06-04: qty 1

## 2020-06-04 MED ORDER — ENSURE ENLIVE PO LIQD
237.0000 mL | Freq: Three times a day (TID) | ORAL | Status: DC
  Administered 2020-06-04 – 2020-06-05 (×4): 237 mL via ORAL

## 2020-06-04 MED ORDER — HYDROXYZINE HCL 25 MG PO TABS: 25.0000 mg | ORAL_TABLET | Freq: Three times a day (TID) | ORAL | Status: DC | PRN

## 2020-06-04 NOTE — Consult Note (Signed)
WOC Nurse Consult Note: Patient with fall at home and asked to consult for pressure injury management. OF note, patient has large bruise present above left eye sustained in fall.  Reason for Consult:thoracic spine with bony prominence and nonblanchable erythema present. Sacrum and upper gluteal folds are moist with red, consistent with moisture associated skin damage (MSD)  She was incontinent with fall and down for a prolonged period.  Both are protected with silicone foam dressing. Purewick in place but bed is saturated with urine. I remove soiled pad, provide peri care and assess patient.  Wound type:pressure and moisture after fall at home Pressure Injury POA: Yes Measurement: Thoracic spine:  1 cm x 0.5 cm nonblanchable erythema over kyphosis, intact skin Sacrum, gluteal folds near apex  2 cm x 2 cm erythema and moisture present.  Wound FVC:BSWHQP Drainage (amount, consistency, odor) none Periwound:Heavy exposure to moisture, with purewick in place.   Dressing procedure/placement/frequency:silicone foam dressing to spine and sacrum.  Barrier cream to perineal skin twice daily and PRN soilage.  Will not follow at this time.  Please re-consult if needed.  Maple Hudson MSN, RN, FNP-BC CWON Wound, Ostomy, Continence Nurse Pager [phone]

## 2020-06-04 NOTE — TOC Progression Note (Signed)
Transition of Care [facility]) - Progression Note    Patient Details  Name: Kristin Browning MRN: [id number] Date of Birth: October 26, 1929  [facility] CM/SW Contact  Chapman Fitch, RN Phone Number: 06/04/2020, 9:59 AM  Clinical Narrative:    PT has recommended SNF.  Discussed recommendation with daughter She is agreeable to bed search, but really only interested in [facility].   Bed search initiated, I have reached out to [facility] directly to ask for them to review  Daughter is going to think more about SNF, but ultimately may take patient home with her, home health services, hospital bed and [facility]  Patient has not had covid vaccines   Expected Discharge Plan: Home w Home Health Services    Expected Discharge Plan and Services Expected Discharge Plan: Home w Home Health Services   Discharge Planning Services: CM Consult   Living arrangements for the past 2 months: Single Family Home                                       Social Determinants of Health (SDOH) Interventions    Readmission Risk Interventions No flowsheet data found.

## 2020-06-04 NOTE — Care Management Important Message (Signed)
Important Message  Patient Details  Name: Kristin Browning MRN: [id number] Date of Birth: [birth year]   Medicare Important Message Given:  Yes     [name] 06/04/2020, 1:58 PM

## 2020-06-04 NOTE — Progress Notes (Signed)
[facility] Progress Note  Patient: Kristin Browning    GGY:[id number]  DOA: 06/01/2020     Date of Service: the patient was seen and examined on 06/04/2020  Chief Complaint  Patient presents with  . Fall   Brief hospital course: Kristin Browning is a 87 y.o. Caucasian female with medical history significant for hypertension, hypertensive cardiomyopathy, SVT, type 2 diabetes mellitus with stage III chronic kidney disease, dyslipidemia and urinary incontinence who presented to the emergency room after having an unwitnessed fall at home where she was apparently on the ground for a few hours before her neighbor came and saw her then called EMS.  She was on the ground for about an hour and a half per her daughter a left it at 6 PM and came back at 7:30 PM. She denies any presyncope or syncope however she said to EMS that she may have passed out.  She is not sure if she remembers everything however is able to tell me that she was getting wood to heat her house when she lost balance and fell.  Sustaining a left frontal hematoma.  She usually ambulates with a walker.  She was incontinent.  Per EMS her blood pressure was elevated at 181/109 with a heart rate of 104 with normal pulse oximetry.  She was having right hip pain.  She denied any left leg pain until she fell.  No recent travels or surgeries.  ED Course: Presented to the ER blood pressure was 168/99 with heart rate of 107 with otherwise normal vital signs.  Labs revealed potassium of 3.6 and a glucose of 128, BUN of 48 and creatinine 1.4 magnesium 2.7.  CK was 1079 CBC showed leukocytosis 16.4 with neutrophilia.  Influenza COVID-19 PCR came back negative. EKG as reviewed by me : EKG showed sinus tachycardia with rate of 104 with left bundle branch block. Imaging: Chest x-ray showed renal beginnings a.m. without evidence of acute traumatic injury.  It showed  iatrogenic changes in the left shoulder and scoliosis of the spine..  Right hip x-ray showed no  fracture. Left lower extremity venous Doppler was positive for DVT involving the left common femoral vein, proximal left saphenous vein proximal left superficial femoral vein and DVT was nonocclusive.  The patient was given 500 mL IV normal saline bolus and was started on IV heparin bolus followed by drip.  She will be admitted to a medically monitored bed for further evaluation and management.    Assessment and Plan:  Principal problem Acute left lower extremity DVT  Active Problems:   AKI (acute kidney injury) (HCC)  #  Acute kidney injury likely prerenal secondary to volume depletion and dehydration with associated altered mental status like secondary to metabolic encephalopathy.  AKI resolved status post IV fluid given for hydration We will follow BMPs. We will hold off nephrotoxins.  # Fall with subsequent acute rhabdomyolysis. -CK 300---111 trended down to within normal range Vitamin D insufficiency, vitamin D level 22.5, vitamin B12 levels within normal range TSH within normal range Continue gentle hydration NS 50 ml/hr PT consult rec SNF placement -We will hold off her statin therapy.   #  Left lower extremity DVT. -s/p IV heparin drip, Eliquis milligram p.o. twice daily for 7 days and then 5 mg p.o. twice daily, start on 06/03/2020 p.m. dose -This could be secondary to nonambulatory status. Patient remains at high risk for bleeding due to anticoagulants and risk of frequent falls.  Risk and benefit discussed with  patient daughter and she verbalized understanding.  #  Essential hypertension. - continue her Cardizem CD. We will Monitor BP and titrate medication accordingly  #  Overactive bladder. - continue Ditropan XL.  # Depression and anxiety, as per her daughter patient was on BuSpar 7.5 mg p.o. 3 times daily which was resumed and started Atarax as needed  Body mass index is 22.06 kg/m.        Diet: Regular denture DVT Prophylaxis: Subcutaneous Heparin   Transition to Eliquis tonight  Advance goals of care discussion: Full code  Family Communication: family was NOT present at bedside, at the time of interview.  The pt provided permission to discuss medical plan with the family. Opportunity was given to ask question and all questions were answered satisfactorily.   Disposition:  Pt is from home, admitted with frequent falls and DVT, still has DVT, debility prone to fall, Discharge to SNF, most likely tomorrow, Covid antigen test ordered  Awaiting for bed availability  Subjective: No overnight issues, patient denies any abdominal pain, no nausea vomiting diarrhea, no chest pain or shortness of breath, no palpitations.  Physical Exam: General:  alert oriented to time, place, and person.  Appear in mild distress, affect appropriate Eyes: PERRLA, bruise  on left forehead ENT: Oral Mucosa Clear, moist  Neck: no JVD,  Cardiovascular: S1 and S2 Present, no Murmur,  Respiratory: good respiratory effort, Bilateral Air entry equal and Decreased, no Crackles, no wheezes Abdomen: Bowel Sound present, Soft and no tenderness,  Skin: no rashes Extremities: 1-2+ Pedal edema, no calf tenderness Neurologic: without any new focal findings Gait not checked due to patient safety concerns  Vitals:   06/04/20 0952 06/04/20 0955 06/04/20 0958 06/04/20 1127  BP: (!) 157/78 (!) 162/87 (!) 162/89 (!) 142/80  Pulse: 87 88 (!) 108 82  Resp: 16   17  Temp: 98.4 F (36.9 C)   98.6 F (37 C)  TempSrc:    Oral  SpO2: 95% 94%  97%  Weight:      Height:        Intake/Output Summary (Last 24 hours) at 06/04/2020 1157 Last data filed at 06/04/2020 1024 Gross per 24 hour  Intake 720 ml  Output 901 ml  Net -181 ml   Filed Weights   06/01/20 2113  Weight: 58.3 kg    Data Reviewed: I have personally reviewed and interpreted daily labs, tele strips, imagings as discussed above. I reviewed all nursing notes, pharmacy notes, vitals, pertinent old records I  have discussed plan of care as described above with RN and patient/family.  CBC: Recent Labs  Lab 06/01/20 2116 06/02/20 0656 06/03/20 0521 06/04/20 0502  WBC 16.4* 16.8* 12.7* 9.3  NEUTROABS 13.3*  --   --   --   HGB 14.4 13.4 12.1 11.0*  HCT 42.9 40.1 36.9 33.6*  MCV 98.2 100.0 101.1* 101.8*  PLT 185 154 135* 135*   Basic Metabolic Panel: Recent Labs  Lab 06/01/20 2116 06/02/20 0656 06/03/20 0521 06/04/20 0502  NA 140 142 140 141  K 3.6 4.9 4.8 4.2  CL 103 108 112* 114*  CO2 24 25 23  21*  GLUCOSE 128* 97 93 85  BUN 48* 47* 44* 35*  CREATININE 1.40* 1.28* 1.03* 0.92  CALCIUM 9.8 9.2 8.6* 8.4*  MG 2.7*  --   --  2.1  PHOS  --   --   --  2.3*    Studies: No results found.  Scheduled Meds: . apixaban  10  mg Oral BID   Followed by  . [START ON 06/10/2020] apixaban  5 mg Oral BID  . busPIRone  7.5 mg Oral TID  . diltiazem  240 mg Oral Daily  . feeding supplement  237 mL Oral TID BM  . influenza vaccine adjuvanted  0.5 mL Intramuscular Tomorrow-1000  . insulin aspart  0-9 Units Subcutaneous TID PC & HS  . Vitamin D (Ergocalciferol)  50,000 Units Oral Q7 days   Continuous Infusions: . sodium chloride 75 mL/hr at 06/04/20 0734   PRN Meds: acetaminophen **OR** acetaminophen, hydrALAZINE, hydrOXYzine, magnesium hydroxide, morphine injection, ondansetron **OR** ondansetron (ZOFRAN) IV, traZODone  Time spent: 35 minutes  Author: Gillis Santa. MD [facility] 06/04/2020 11:57 AM  To reach On-call, see care teams to locate the attending and reach out to them via [email or web address]. If 7PM-7AM, please contact night-coverage If you still have difficulty reaching the attending provider, please page the [facility] (Director on Call) for [facility] on amion for assistance.

## 2020-06-04 NOTE — TOC Progression Note (Signed)
Transition of Care [facility]) - Progression Note    Patient Details  Name: Kristin Browning MRN: [id number] Date of Birth: 04-29-1929  [facility]) CM/SW Contact  Chapman Fitch, RN Phone Number: 06/04/2020, 12:39 PM  Clinical Narrative:    [facility] is able to offer.  Called daughter to present bed offers.   She accepted bed at [facility].  Accepted in the HUB  Discussed with patient and she is in agreement  Originally [facility] said they managed the plan, received return call from [place] they do not manage plan. Managed by [facility].   Per Verlon Au they will have a bed tomorrow. Per MD patient will be ready for DC tomorrow. I have notified Verlon Au that she needs to start auth.    MD to order covid test    Expected Discharge Plan: Home w Home Health Services    Expected Discharge Plan and Services Expected Discharge Plan: Home w Home Health Services   Discharge Planning Services: CM Consult   Living arrangements for the past 2 months: Single Family Home                                       Social Determinants of Health (SDOH) Interventions    Readmission Risk Interventions No flowsheet data found.

## 2020-06-04 NOTE — Progress Notes (Signed)
Order received to discontinue orthostatics

## 2020-06-04 NOTE — Evaluation (Signed)
Occupational Therapy Evaluation Patient Details Name: Kristin Browning MRN: [id number] DOB: [birth year] Today's Date: 06/04/2020    History of Present Illness Kristin Browning is an 87 y.o. female  From home s/p fall.  She had potentially been on the floor for several hours before neighbor came and saw and called EMS.  She lives home alone and her daughter occasionally brings her meals (daughter ill with MS).   Clinical Impression   Patient presenting with decreased I in self care, balance, functional mobility/transfers, endurance, and safety awareness.  Patient reports being mod I and living at home alone  PTA. Pt's children have been intermittently providing assistance but unable to provide 24/7 assist per pt report. Pt standing with mod A and transferred to [facility] [place] for toileting need. Pt able to have BM but needing assistance for hygiene and clothing management. Pt fatigues very quickly. Patient will benefit from acute OT to increase overall independence in the areas of ADLs, functional mobility, and safety awareness in order to safely discharge to next venue of care.   Follow Up Recommendations  SNF;Supervision/Assistance - 24 hour    Equipment Recommendations  Other (comment) (defer to next venue of care)       Precautions / Restrictions Precautions Precautions: Fall      Mobility Bed Mobility Overal bed mobility: Needs Assistance Bed Mobility: Supine to Sit     Supine to sit: Mod assist     General bed mobility comments: Pt requires verbal, tactile cuing for hand placement and requires modA from therapist to come upright with use of UEs.    Transfers Overall transfer level: Needs assistance   Transfers: Sit to/from Stand Sit to Stand: Min assist         General transfer comment: Pt requires minA from therapist for standing upright and elevated surface.    Balance Overall balance assessment: Needs assistance Sitting-balance support: Feet supported Sitting balance-Leahy Scale:  Fair Sitting balance - Comments: Pt has severe kyphosis of the back that keeps her in a forward lean in sitting.   Standing balance support: Bilateral upper extremity supported Standing balance-Leahy Scale: Poor Standing balance comment: Pt requires assistance through UE support and FWW to remain upright.                           ADL either performed or assessed with clinical judgement   ADL Overall ADL's : Needs assistance/impaired     Grooming: Wash/dry hands;Wash/dry face;Sitting;Set up                   Toilet Transfer: Moderate assistance;RW;BSC   Toileting- Clothing Manipulation and Hygiene: Sit to/from stand;Moderate assistance Toileting - Clothing Manipulation Details (indicate cue type and reason): Pt needing assistance with hygiene and clothing management     Functional mobility during ADLs: Minimal assistance;Rolling walker;Cueing for safety       Vision Patient Visual Report: No change from baseline              Pertinent Vitals/Pain Pain Assessment: Faces Faces Pain Scale: Hurts a little bit Pain Location: Bilateral soreness of feet. Pain Descriptors / Indicators: Sore;Discomfort Pain Intervention(s): Limited activity within patient's tolerance;Monitored during session;Repositioned     Hand Dominance Right   Extremity/Trunk Assessment Upper Extremity Assessment Upper Extremity Assessment: Generalized weakness   Lower Extremity Assessment Lower Extremity Assessment: Generalized weakness       Communication Communication Communication: No difficulties   Cognition Arousal/Alertness: Awake/alert Behavior During Therapy:  WFL for tasks assessed/performed Overall Cognitive Status: Within Functional Limits for tasks assessed                                                Home Living Family/patient expects to be discharged to:: Private residence Living Arrangements: Alone Available Help at Discharge: Family Type  of Home: House Home Access: Stairs to enter [employer] of Steps: 4 Entrance Stairs-Rails: Right Home Layout: Two level;Able to live on main level with bedroom/bathroom Alternate Level Stairs-Number of Steps: 12       Bathroom Toilet: Handicapped height Bathroom Accessibility: Yes   Home Equipment: Grab bars - toilet;Walker - 4 wheels;Bedside commode          Prior Functioning/Environment Level of Independence: Independent with assistive device(s)        Comments: Pt reports taking "bird baths at home" and reports trying to modify some tasks secondary to " I know I can't do some things now"        OT Problem List: Decreased strength;Impaired balance (sitting and/or standing);Pain;Decreased safety awareness;Decreased activity tolerance;Decreased knowledge of use of DME or AE      OT Treatment/Interventions: Self-care/ADL training;Manual therapy;Therapeutic exercise;Patient/family education;Balance training;Energy conservation;Therapeutic activities;DME and/or AE instruction;Cognitive remediation/compensation    OT Goals(Current goals can be found in the care plan section) Acute Rehab OT Goals Patient Stated Goal: To get stronger and go home. OT Goal Formulation: With patient Time For Goal Achievement: 06/18/20 Potential to Achieve Goals: Good ADL Goals Pt Will Perform Grooming: with supervision;standing Pt Will Perform Lower Body Dressing: with supervision;sit to/from stand Pt Will Transfer to Toilet: with supervision;ambulating Pt Will Perform Toileting - Clothing Manipulation and hygiene: with supervision;sit to/from stand  OT Frequency: Min 1X/week   Barriers to D/C:    Pt reports having a son that works during the day and her daughter has MS and is unable to assist per pt report          AM-PAC OT "6 Clicks" Daily Activity     Outcome Measure Help from another person eating meals?: None Help from another person taking care of personal grooming?: A  Little Help from another person toileting, which includes using toliet, bedpan, or urinal?: A Lot Help from another person bathing (including washing, rinsing, drying)?: A Lot Help from another person to put on and taking off regular upper body clothing?: A Little Help from another person to put on and taking off regular lower body clothing?: A Lot 6 Click Score: 16   End of Session Equipment Utilized During Treatment: Rolling walker;Other (comment) [facility]) Nurse Communication: Mobility status;Other (comment) (skin check)  Activity Tolerance: Patient tolerated treatment well Patient left: in bed;with call bell/phone within reach;with bed alarm set  OT Visit Diagnosis: Unsteadiness on feet (R26.81);Repeated falls (R29.6);Muscle weakness (generalized) (M62.81)                Time: 3888-2800 OT Time Calculation (min): 21 min Charges:  OT General Charges $OT Visit: 1 Visit OT Evaluation $OT Eval Low Complexity: 1 Low OT Treatments $Self Care/Home Management : 8-22 mins  Jackquline Denmark, MS, OTR/L , CBIS ascom [phone]  06/04/20, 1:14 PM

## 2020-06-05 DIAGNOSIS — G9341 Metabolic encephalopathy: Secondary | ICD-10-CM | POA: Diagnosis not present

## 2020-06-05 DIAGNOSIS — R279 Unspecified lack of coordination: Secondary | ICD-10-CM | POA: Diagnosis not present

## 2020-06-05 DIAGNOSIS — N1831 Chronic kidney disease, stage 3a: Secondary | ICD-10-CM | POA: Diagnosis not present

## 2020-06-05 DIAGNOSIS — I471 Supraventricular tachycardia: Secondary | ICD-10-CM | POA: Diagnosis not present

## 2020-06-05 DIAGNOSIS — S0083XD Contusion of other part of head, subsequent encounter: Secondary | ICD-10-CM | POA: Diagnosis not present

## 2020-06-05 DIAGNOSIS — Z743 Need for continuous supervision: Secondary | ICD-10-CM | POA: Diagnosis not present

## 2020-06-05 DIAGNOSIS — W19XXXD Unspecified fall, subsequent encounter: Secondary | ICD-10-CM | POA: Diagnosis not present

## 2020-06-05 DIAGNOSIS — I824Y9 Acute embolism and thrombosis of unspecified deep veins of unspecified proximal lower extremity: Secondary | ICD-10-CM | POA: Diagnosis not present

## 2020-06-05 DIAGNOSIS — I429 Cardiomyopathy, unspecified: Secondary | ICD-10-CM | POA: Diagnosis not present

## 2020-06-05 DIAGNOSIS — R296 Repeated falls: Secondary | ICD-10-CM | POA: Diagnosis not present

## 2020-06-05 DIAGNOSIS — E1122 Type 2 diabetes mellitus with diabetic chronic kidney disease: Secondary | ICD-10-CM | POA: Diagnosis not present

## 2020-06-05 DIAGNOSIS — N183 Chronic kidney disease, stage 3 unspecified: Secondary | ICD-10-CM | POA: Diagnosis not present

## 2020-06-05 DIAGNOSIS — T796XXD Traumatic ischemia of muscle, subsequent encounter: Secondary | ICD-10-CM | POA: Diagnosis not present

## 2020-06-05 DIAGNOSIS — W19XXXA Unspecified fall, initial encounter: Secondary | ICD-10-CM | POA: Diagnosis not present

## 2020-06-05 DIAGNOSIS — I824Y2 Acute embolism and thrombosis of unspecified deep veins of left proximal lower extremity: Secondary | ICD-10-CM | POA: Diagnosis not present

## 2020-06-05 DIAGNOSIS — I129 Hypertensive chronic kidney disease with stage 1 through stage 4 chronic kidney disease, or unspecified chronic kidney disease: Secondary | ICD-10-CM | POA: Diagnosis not present

## 2020-06-05 DIAGNOSIS — R404 Transient alteration of awareness: Secondary | ICD-10-CM | POA: Diagnosis not present

## 2020-06-05 LAB — BASIC METABOLIC PANEL
Anion gap: 5 (ref 5–15)
BUN: 31 mg/dL — ABNORMAL HIGH (ref 8–23)
CO2: 22 mmol/L (ref 22–32)
Calcium: 8.6 mg/dL — ABNORMAL LOW (ref 8.9–10.3)
Chloride: 114 mmol/L — ABNORMAL HIGH (ref 98–111)
Creatinine, Ser: 0.93 mg/dL (ref 0.44–1.00)
GFR, Estimated: 59 mL/min — ABNORMAL LOW (ref >60)
Glucose, Bld: 95 mg/dL (ref 70–99)
Potassium: 4.3 mmol/L (ref 3.5–5.1)
Sodium: 141 mmol/L (ref 135–145)

## 2020-06-05 LAB — CBC
HCT: 33.1 % — ABNORMAL LOW (ref 36.0–46.0)
Hemoglobin: 10.6 g/dL — ABNORMAL LOW (ref 12.0–15.0)
MCH: 32.6 pg (ref 26.0–34.0)
MCHC: 32 g/dL (ref 30.0–36.0)
MCV: 101.8 fL — ABNORMAL HIGH (ref 80.0–100.0)
Platelets: 146 10*3/uL — ABNORMAL LOW (ref 150–400)
RBC: 3.25 MIL/uL — ABNORMAL LOW (ref 3.87–5.11)
RDW: 15.1 % (ref 11.5–15.5)
WBC: 8.3 10*3/uL (ref 4.0–10.5)
nRBC: 0 % (ref 0.0–0.2)

## 2020-06-05 LAB — URINE CULTURE: Culture: >=100000 — AB

## 2020-06-05 LAB — PHOSPHORUS: Phosphorus: 2.6 mg/dL (ref 2.5–4.6)

## 2020-06-05 LAB — GLUCOSE, CAPILLARY
Glucose-Capillary: 83 mg/dL (ref 70–99)
Glucose-Capillary: 98 mg/dL (ref 70–99)

## 2020-06-05 LAB — MAGNESIUM: Magnesium: 2.1 mg/dL (ref 1.7–2.4)

## 2020-06-05 MED ORDER — HYDROXYZINE HCL 25 MG PO TABS: 25.0000 mg | ORAL_TABLET | Freq: Three times a day (TID) | ORAL | 0 refills | Status: DC | PRN

## 2020-06-05 MED ORDER — BUSPIRONE HCL 7.5 MG PO TABS
7.5000 mg | ORAL_TABLET | Freq: Three times a day (TID) | ORAL | 0 refills | Status: AC
Start: 2020-06-05 — End: 2020-07-05

## 2020-06-05 MED ORDER — APIXABAN 5 MG PO TABS: 10.0000 mg | ORAL_TABLET | Freq: Two times a day (BID) | ORAL | Status: DC

## 2020-06-05 MED ORDER — CEPHALEXIN 500 MG PO CAPS
500.0000 mg | ORAL_CAPSULE | Freq: Three times a day (TID) | ORAL | Status: DC
  Administered 2020-06-05 (×2): 500 mg via ORAL
  Filled 2020-06-05 (×2): qty 1

## 2020-06-05 MED ORDER — CEPHALEXIN 500 MG PO CAPS: 500.0000 mg | ORAL_CAPSULE | Freq: Three times a day (TID) | ORAL | 0 refills | Status: AC

## 2020-06-05 MED ORDER — TRAZODONE HCL 50 MG PO TABS: 25.0000 mg | ORAL_TABLET | Freq: Every evening | ORAL | Status: DC | PRN

## 2020-06-05 MED ORDER — VITAMIN D (ERGOCALCIFEROL) 1.25 MG (50000 UNIT) PO CAPS: 50000.0000 [IU] | ORAL_CAPSULE | ORAL | 0 refills | Status: AC

## 2020-06-05 MED ORDER — APIXABAN 5 MG PO TABS: 5.0000 mg | ORAL_TABLET | Freq: Two times a day (BID) | ORAL | Status: DC

## 2020-06-05 NOTE — Discharge Summary (Signed)
[facility] Discharge Summary   Patient: Kristin Browning LOV:[id number]  PCP: Lauro Regulus, MD  Date of admission: 06/01/2020   Date of discharge:  06/05/2020     Discharge Diagnoses:   Principal Problem:   DVT (deep venous thrombosis) (HCC) Active Problems:   AKI (acute kidney injury) (HCC)   Rhabdomyolysis   Admitted From: home Disposition:  SNF   Recommendations for Outpatient Follow-up:  1. PCP: in 1 wk 2. Follow up LABS/TEST:  Cbc in 1 wk, FOBT if dark stools    Contact information for after-discharge care    Destination    [facility] .   Service: Skilled Nursing Contact information: [address] [place] [address] [phone]                 Diet recommendation: Regular diet  Activity: The patient is advised to gradually reintroduce usual activities, as tolerated  Discharge Condition: stable  Code Status: Full code   History of present illness: As per the H and P dictated on admission Hospital Course:  Kristin Browning a 87 y.o.Caucasian femalewith medical history significant forhypertension, hypertensive cardiomyopathy, SVT, type 2 diabetes mellitus with stage III chronic kidney disease, dyslipidemia and urinary incontinence who presented to the emergency room after having an unwitnessed fall at home where she was apparently on the ground for a few hours before her neighbor came and saw her then called EMS.She was on the ground for about an hour and a half per her daughter a left it at 6 PM and came back at 7:30 PM.She denies any presyncope or syncopehowever she said to EMS that she may have passed out. She is not sure if she remembers everything however is able to tell me that she was getting wood to heat her house when she lost balance and fell. Sustaining a left frontal hematoma. She usually ambulates with a walker. She was incontinent. Per EMS  her blood pressure was elevated at 181/109 with a heart rate of 104 with normal pulse oximetry. She was having right hip pain.She denied any left leg pain until she fell. No recent travels or surgeries.  ED Course:Presented to the ER blood pressure was 168/99 with heart rate of 107 with otherwise normal vital signs. Labs revealed potassium of 3.6 and a glucose of 128, BUN of 48 and creatinine 1.4 magnesium 2.7. CK was 1079 CBC showed leukocytosis 16.4 with neutrophilia. Influenza COVID-19 PCR came back negative. EKG as reviewed by me :EKG showed sinus tachycardia with rate of 104 with left bundle branch block. Imaging:Chest x-ray showed renal beginnings a.m. without evidence of acute traumatic injury. It showed iatrogenic changes in the left shoulder and scoliosis of the spine.. Right hip x-ray showed no fracture. Left lower extremity venous Doppler was positive for DVT involving the left common femoral vein, proximal left saphenous vein proximal left superficial femoral vein and DVT was nonocclusive.  The patient was given 500 mL IV normal saline bolus and was started on IV heparin bolus followed by drip. She will be admitted to a medically monitored bed for further evaluation and management.  Assessment and plan # Left lower extremity DVT. -s/p IV heparin drip, Eliquis 10 mg po BID x 7 days and then 5 mg p.o. twice daily, start on 06/03/2020 p.m. dose. This could be secondary to nonambulatory status. Patient remains at high risk for bleeding due to anticoagulants and risk of frequent falls.  Risk and benefit discussed with patient daughter and she verbalized understanding. Repeat CBC after 1 week and check FOBT of dark follow with hematology as an outpatient for further work-up and duration of treatment. # Acute kidney injury likely prerenal secondary to volume depletion and dehydration with associated altered mental status like secondary to metabolic encephalopathy. AKI resolved  status post IV fluid given for hydration.  Continue oral hydration #Fall with subsequent acute rhabdomyolysis. CK 300---111 trended down to within normal range. Vitamin D insufficiency, vitamin D level 22.5, vitamin B12 levels within normal range. TSH within normal range. S/p IVF given for hydration. PT consult rec SNF placement.  # Essential hypertension and tachycardia. continue her Cardizem CD. # Overactive bladder, continue Ditropan XL. # Depression and anxiety, as per her daughter patient was on BuSpar 7.5 mg p.o. 3 times daily which was resumed and started Atarax as needed # UTI, urine culture growing Proteus mirabilis sensitive to cephalosporin, started Keflex 500 mg po TID for 5 days. Body mass index is 22.06 kg/m.  Continue Ensure supplement and encourage oral diet intake Patient was seen by physical therapy, who recommended SNF, which was arranged. On the day of the discharge the patient's vitals were stable, and no other acute medical condition were reported by patient. the patient was felt safe to be discharge at [facility].  Consultants: None Procedures: none  Discharge Exam: General: Appear in no distress, no Rash; Oral Mucosa Clear, moist. Cardiovascular: S1 and S2 Present, no Murmur, Respiratory: normal respiratory effort, Bilateral Air entry present and no Crackles, no wheezes Abdomen: Bowel Sound present, Soft and no tenderness, no hernia Extremities: no Pedal edema, no calf tenderness Neurology: alert and oriented to time, place, and person affect appropriate.  Filed Weights   06/01/20 2113  Weight: 58.3 kg   Vitals:   06/05/20 0423 06/05/20 0815  BP: 139/83 (!) 105/59  Pulse: 79 63  Resp: 20 16  Temp: 98.3 F (36.8 C) 98.2 F (36.8 C)  SpO2: 93% 94%    DISCHARGE MEDICATION: Allergies as of 06/05/2020   Not on File     Medication List    STOP taking these medications   DULoxetine 30 MG capsule Commonly known as: CYMBALTA     TAKE these medications    apixaban 5 MG Tabs tablet Commonly known as: ELIQUIS Take 2 tablets (10 mg total) by mouth 2 (two) times daily for 10 doses.   apixaban 5 MG Tabs tablet Commonly known as: ELIQUIS Take 1 tablet (5 mg total) by mouth 2 (two) times daily. Start first dose at night on 05/13/20 Start taking on: June 10, 2020   busPIRone 7.5 MG tablet Commonly known as: BUSPAR Take 1 tablet (7.5 mg total) by mouth 3 (three) times daily.   cephALEXin 500 MG capsule Commonly known as: KEFLEX Take 1 capsule (500 mg total) by mouth every 8 (eight) hours for 5 days.   diltiazem 240 MG 24 hr capsule Commonly known as: DILACOR XR Take 240 mg by mouth daily.   hydrOXYzine 25 MG tablet Commonly known as: ATARAX/VISTARIL Take 1 tablet (25 mg total) by mouth 3 (three) times daily as needed for anxiety.   oxybutynin 10 MG 24 hr tablet Commonly known as: DITROPAN-XL Take 10 mg by mouth daily.   PreserVision AREDS 2+Multi Vit Caps Take 1 capsule by mouth daily.   timolol 0.5 % ophthalmic solution Commonly known as: TIMOPTIC Place 1 drop into both eyes daily.   traZODone 50 MG tablet Commonly known as:  DESYREL Take 0.5 tablets (25 mg total) by mouth at bedtime as needed for sleep.   Vitamin D (Ergocalciferol) 1.25 MG (50000 UNIT) Caps capsule Commonly known as: DRISDOL Take 1 capsule (50,000 Units total) by mouth every 7 (seven) days for 3 doses. Start taking on: June 10, 2020            Discharge Care Instructions  (From admission, onward)         Start     Ordered   06/05/20 0000  Discharge wound care:       Comments: As mentioned by wound care   06/05/20 1257         Not on File Discharge Instructions    Call MD for:  severe uncontrolled pain   Complete by: As directed    Call MD for:  temperature >100.4   Complete by: As directed    Diet - low sodium heart healthy   Complete by: As directed    Discharge instructions   Complete by: As directed    Patient should be seen by an  MD in 1 to 2 days, continue to monitor blood pressure and heart rate and titrate medication accordingly. Follow-up with hematologist as an outpatient for DVT work-up as an outpatient and duration of treatment. Watch for any signs of bleeding, send stool sample for FOBT to rule out GI bleeding if any suspicion.   Repeat CBC after 1 week to check hemoglobin Follow with podiatry for toenails follow with ophthalmology for glaucoma per schedule   Discharge wound care:   Complete by: As directed    As mentioned by wound care   Increase activity slowly   Complete by: As directed    No wound care   Complete by: As directed       The results of significant diagnostics from this hospitalization (including imaging, microbiology, ancillary and laboratory) are listed below for reference.    Significant Diagnostic Studies: DG Pelvis 1-2 Views  Result Date: 06/01/2020 CLINICAL DATA:  Weakness, fall, right hip pain EXAM: PELVIS - 1-2 VIEW COMPARISON:  None. FINDINGS: Mild patient rotation. No evidence of acute fracture. Both femoral heads are seated in the acetabulum. Pubic rami are grossly intact. Degenerative change of the pubic symphysis which remains congruent. The bones are diffusely under mineralized. There are vascular calcifications. IMPRESSION: No evidence of acute fracture of the pelvis. Electronically Signed   By: Narda Rutherford M.D.   On: 06/01/2020 22:52   CT Head Wo Contrast  Result Date: 06/01/2020 CLINICAL DATA:  Unwitnessed fall. EXAM: CT HEAD WITHOUT CONTRAST TECHNIQUE: Contiguous axial images were obtained from the base of the skull through the vertex without intravenous contrast. COMPARISON:  None. FINDINGS: Brain: No intracranial hemorrhage, mass effect, or midline shift. Generalized atrophy, normal for age. No hydrocephalus. The basilar cisterns are patent. Moderate periventricular and deep white matter hypodensity typical of chronic small vessel ischemia. Suspect small  periventricular lacunar infarct in the left frontal lobe. No evidence of territorial infarct or acute ischemia. No extra-axial or intracranial fluid collection. Small incidental falx lipoma. Vascular: Atherosclerosis of skullbase vasculature without hyperdense vessel or abnormal calcification. Skull: No fracture or focal lesion. Sinuses/Orbits: Paranasal sinuses and mastoid air cells are clear. The visualized orbits are unremarkable. Bilateral cataract resection. Other: Small left frontal scalp contusion. IMPRESSION: 1. Small left frontal scalp contusion. No acute intracranial abnormality. No skull fracture. 2. Age related atrophy and chronic small vessel ischemia. Electronically Signed   By: Ivette Loyal.D.  On: 06/01/2020 21:37   CT Cervical Spine Wo Contrast  Result Date: 06/01/2020 CLINICAL DATA:  Neck trauma (Age >= 65y) Unwitnessed fall. EXAM: CT CERVICAL SPINE WITHOUT CONTRAST TECHNIQUE: Multidetector CT imaging of the cervical spine was performed without intravenous contrast. Multiplanar CT image reconstructions were also generated. COMPARISON:  None. FINDINGS: Alignment: Trace anterolisthesis of C7 on T1, likely degenerative. No traumatic subluxation. Skull base and vertebrae: No acute fracture. Vertebral body heights are maintained. The dens and skull base are intact. Partial ankylosis of left C2-C3 facet is likely degenerative. Soft tissues and spinal canal: No prevertebral fluid or swelling. No visible canal hematoma. Disc levels: Diffuse disc space narrowing and endplate spurring. Mild multilevel facet hypertrophy. No high-grade canal stenosis. Upper chest: No acute or unexpected findings. Other: None. IMPRESSION: Degenerative change in the cervical spine without acute fracture or subluxation. Electronically Signed   By: Narda RutherfordMelanie  Sanford M.D.   On: 06/01/2020 21:40   US Venous Img Lower Unilateral Left  Addendum Date: 06/01/2020   ADDENDUM REPORT: 06/01/2020 22:58 ADDENDUM: It should be  noted that the previously described areas of LEFT lower extremity DVT are nonocclusive in nature. Electronically Signed   By: Aram Candelahaddeus  Houston M.D.   On: 06/01/2020 22:58   Result Date: 06/01/2020 CLINICAL DATA:  Left lower extremity swelling. EXAM: LEFT LOWER EXTREMITY VENOUS DOPPLER ULTRASOUND TECHNIQUE: Gray-scale sonography with compression, as well as color and duplex ultrasound, were performed to evaluate the deep venous system(s) from the level of the common femoral vein through the popliteal and proximal calf veins. COMPARISON:  None. FINDINGS: VENOUS There is incomplete compressibility of the LEFT common femoral vein, proximal LEFT saphenous vein and proximal LEFT superficial femoral vein. There is normal compressibility of the mid and distal LEFT superficial femoral, and LEFT popliteal veins, as well as the visualized LEFT calf veins. Echogenic filling defects are also seen within the LEFT common femoral vein, proximal LEFT saphenous vein and proximal LEFT superficial femoral vein on grayscale or color Doppler imaging. Doppler waveforms show normal direction of venous flow and normal respiratory plasticity. Limited views of the contralateral common femoral vein are unremarkable. OTHER None. Limitations: none IMPRESSION: Findings consistent with DVT within the LEFT common femoral vein, proximal LEFT saphenous vein and proximal LEFT superficial femoral vein. Electronically Signed: By: Aram Candelahaddeus  Houston M.D. On: 06/01/2020 22:49   DG Chest Portable 1 View  Result Date: 06/01/2020 CLINICAL DATA:  Weakness, fall, right hip pain.  Unwitnessed fall. EXAM: PORTABLE CHEST 1 VIEW COMPARISON:  None. FINDINGS: Significant patient rotation. The heart is normal in size. Aortic atherosclerosis. No evidence of focal airspace disease, pleural effusion, or pneumothorax. No pulmonary edema. Degenerative change of the left shoulder. Scoliotic curvature of the spine. No acute osseous abnormalities are seen. IMPRESSION:  Rotated exam without evidence of acute traumatic injury to the chest. Electronically Signed   By: Narda RutherfordMelanie  Sanford M.D.   On: 06/01/2020 22:51    Microbiology: Recent Results (from the past 240 hour(s))  Resp Panel by RT-PCR (Flu A&B, Covid) Nasopharyngeal Swab     Status: None   Collection Time: 06/01/20 11:35 PM   Specimen: Nasopharyngeal Swab; Nasopharyngeal(NP) swabs in vial transport medium  Result Value Ref Range Status   SARS Coronavirus 2 by RT PCR NEGATIVE NEGATIVE Final    Comment: (NOTE) SARS-CoV-2 target nucleic acids are NOT DETECTED.  The SARS-CoV-2 RNA is generally detectable in upper respiratory specimens during the acute phase of infection. The lowest concentration of SARS-CoV-2 viral copies this assay can  detect is 138 copies/mL. A negative result does not preclude SARS-Cov-2 infection and should not be used as the sole basis for treatment or other patient management decisions. A negative result may occur with  improper specimen collection/handling, submission of specimen other than nasopharyngeal swab, presence of viral mutation(s) within the areas targeted by this assay, and inadequate number of viral copies(<138 copies/mL). A negative result must be combined with clinical observations, patient history, and epidemiological information. The expected result is Negative.  Fact Sheet for Patients:  BloggerCourse.com  Fact Sheet for Healthcare Providers:  SeriousBroker.it  This test is no t yet approved or cleared by the [place] FDA and  has been authorized for detection and/or diagnosis of SARS-CoV-2 by FDA under an Emergency Use Authorization (EUA). This EUA will remain  in effect (meaning this test can be used) for the duration of the COVID-19 declaration under Section 564(b)(1) of the Act, 21 U.S.C.section 360bbb-3(b)(1), unless the authorization is terminated  or revoked sooner.       Influenza A by  PCR NEGATIVE NEGATIVE Final   Influenza B by PCR NEGATIVE NEGATIVE Final    Comment: (NOTE) The Xpert Xpress SARS-CoV-2/FLU/RSV plus assay is intended as an aid in the diagnosis of influenza from Nasopharyngeal swab specimens and should not be used as a sole basis for treatment. Nasal washings and aspirates are unacceptable for Xpert Xpress SARS-CoV-2/FLU/RSV testing.  Fact Sheet for Patients: BloggerCourse.com  Fact Sheet for Healthcare Providers: SeriousBroker.it  This test is not yet approved or cleared by the [place] FDA and has been authorized for detection and/or diagnosis of SARS-CoV-2 by FDA under an Emergency Use Authorization (EUA). This EUA will remain in effect (meaning this test can be used) for the duration of the COVID-19 declaration under Section 564(b)(1) of the Act, 21 U.S.C. section 360bbb-3(b)(1), unless the authorization is terminated or revoked.  Performed at [facility], [address] [place], [place] [address]   Urine Culture     Status: Abnormal   Collection Time: 06/02/20  5:18 AM   Specimen: Urine, Random  Result Value Ref Range Status   Specimen Description   Final    URINE, RANDOM Performed at [facility], [address] [place], [place] [address]    Special Requests   Final    NONE Performed at [facility], [address] [place], [place] [address]    Culture >=100,000 COLONIES/mL PROTEUS MIRABILIS (A)  Final   Report Status 06/05/2020 FINAL  Final   Organism ID, Bacteria PROTEUS MIRABILIS (A)  Final      Susceptibility   Proteus mirabilis - MIC*    AMPICILLIN <=2 SENSITIVE Sensitive     CEFAZOLIN <=4 SENSITIVE Sensitive     CEFEPIME <=0.12 SENSITIVE Sensitive     CEFTRIAXONE <=0.25 SENSITIVE Sensitive     CIPROFLOXACIN <=0.25 SENSITIVE Sensitive     GENTAMICIN <=1 SENSITIVE Sensitive     IMIPENEM 1 SENSITIVE Sensitive     NITROFURANTOIN 256  RESISTANT Resistant     TRIMETH/SULFA <=20 SENSITIVE Sensitive     AMPICILLIN/SULBACTAM <=2 SENSITIVE Sensitive     PIP/TAZO <=4 SENSITIVE Sensitive     * >=100,000 COLONIES/mL PROTEUS MIRABILIS  SARS CORONAVIRUS 2 (TAT 6-24 HRS) Nasopharyngeal Nasopharyngeal Swab     Status: None   Collection Time: 06/04/20 12:03 PM   Specimen: Nasopharyngeal Swab  Result Value Ref Range Status   SARS Coronavirus 2 NEGATIVE NEGATIVE Final    Comment: (NOTE) SARS-CoV-2 target nucleic acids are  NOT DETECTED.  The SARS-CoV-2 RNA is generally detectable in upper and lower respiratory specimens during the acute phase of infection. Negative results do not preclude SARS-CoV-2 infection, do not rule out co-infections with other pathogens, and should not be used as the sole basis for treatment or other patient management decisions. Negative results must be combined with clinical observations, patient history, and epidemiological information. The expected result is Negative.  Fact Sheet for Patients: HairSlick.no  Fact Sheet for Healthcare Providers: quierodirigir.com  This test is not yet approved or cleared by the [place] FDA and  has been authorized for detection and/or diagnosis of SARS-CoV-2 by FDA under an Emergency Use Authorization (EUA). This EUA will remain  in effect (meaning this test can be used) for the duration of the COVID-19 declaration under Se ction 564(b)(1) of the Act, 21 U.S.C. section 360bbb-3(b)(1), unless the authorization is terminated or revoked sooner.  Performed at [facility], [address], [place], [place] [address]      Labs: CBC: Recent Labs  Lab 06/01/20 2116 06/02/20 0656 06/03/20 0521 06/04/20 0502 06/05/20 0511  WBC 16.4* 16.8* 12.7* 9.3 8.3  NEUTROABS 13.3*  --   --   --   --   HGB 14.4 13.4 12.1 11.0* 10.6*  HCT 42.9 40.1 36.9 33.6* 33.1*  MCV 98.2 100.0 101.1* 101.8* 101.8*  PLT  185 154 135* 135* 146*   Basic Metabolic Panel: Recent Labs  Lab 06/01/20 2116 06/02/20 0656 06/03/20 0521 06/04/20 0502 06/05/20 0511  NA 140 142 140 141 141  K 3.6 4.9 4.8 4.2 4.3  CL 103 108 112* 114* 114*  CO2 21* 22  GLUCOSE 128* 97 93 85 95  BUN 48* 47* 44* 35* 31*  CREATININE 1.40* 1.28* 1.03* 0.92 0.93  CALCIUM 9.8 9.2 8.6* 8.4* 8.6*  MG 2.7*  --   --  2.1 2.1  PHOS  --   --   --  2.3* 2.6   Liver Function Tests: Recent Labs  Lab 06/01/20 2116  AST 62*  ALT 19  ALKPHOS 53  BILITOT 1.2  PROT 7.2  ALBUMIN 4.1   No results for input(s): LIPASE, AMYLASE in the last 168 hours. No results for input(s): AMMONIA in the last 168 hours. Cardiac Enzymes: Recent Labs  Lab 06/01/20 2116 06/02/20 0656 06/03/20 0521 06/04/20 0502  CKTOTAL 1,079* 701* 300* 111   BNP (last 3 results) No results for input(s): BNP in the last 8760 hours. CBG: Recent Labs  Lab 06/04/20 1120 06/04/20 1639 06/04/20 2100 06/05/20 0738 06/05/20 1201  GLUCAP 98 112* 105* 83 98    Time spent: 35 minutes  Signed:  Gillis Santa  [facility]  06/05/2020 1:04 PM

## 2020-06-05 NOTE — TOC Transition Note (Signed)
Transition of Care [facility]) - CM/SW Discharge Note   Patient Details  Name: Kristin Browning MRN: [id number] Date of Birth: 1929/11/15  [facility] CM/SW Contact:  Caryn Section, RN Phone Number: 06/05/2020, 1:55 PM   Clinical Narrative:   Patient to transfer to [facility] today by [facility], 2nd on list to transfer this afternoon.  Message left on daughter's phone.  TOC signing off.    Final next level of care: Skilled Nursing Facility Barriers to Discharge: Barriers Resolved   Patient Goals and CMS Choice     Choice offered to / list presented to : Patient,Adult Children  Discharge Placement PASRR number recieved: 06/05/20            Patient [facility] bed at: [facility] Patient to be transferred to facility by: [facility] Name of family member notified: Michelene Heady (Daughter)   [phone] (Mobile), left voice mail Patient and family notified of of transfer: 06/05/20  Discharge Plan and Services   Discharge Planning Services: CM Consult              DME Agency:  (n/a going to skilled facility)     Representative spoke with at DME Agency: n/a HH Arranged:  (n/a going to skilled facility)          Social Determinants of Health (SDOH) Interventions     Readmission Risk Interventions No flowsheet data found.

## 2020-06-05 NOTE — Plan of Care (Signed)

## 2020-06-05 NOTE — Progress Notes (Signed)
Physical Therapy Treatment Patient Details Name: RUWAYDA CURET MRN: [id number] DOB: 02/07/1930 Today's Date: 06/05/2020    History of Present Illness Ayriana TULANI KIDNEY is an 87 y.o. female  From home s/p fall.  She had potentially been on the floor for several hours before neighbor came and saw and called EMS.  She lives home alone and her daughter occasionally brings her meals (daughter ill with MS).    PT Comments    Assisted pt OOB to commode then she was able to walk to door and back with RW and min a x 1.  Limited by LE weakness and general fatigue.  Poor hand placements and general use of walker that require verbal and tactile cues.  Declined OOB to chair after session.  SNF remains appropriate.   Follow Up Recommendations  SNF;Supervision/Assistance - 24 hour     Equipment Recommendations   (Determine at next venue of care.)    Recommendations for Other Services       Precautions / Restrictions Precautions Precautions: Fall    Mobility  Bed Mobility Overal bed mobility: Needs Assistance Bed Mobility: Supine to Sit;Sit to Supine     Supine to sit: Min assist Sit to supine: Mod assist        Transfers Overall transfer level: Needs assistance Equipment used: Rolling walker (2 wheeled) Transfers: Sit to/from Stand Sit to Stand: Min assist            Ambulation/Gait Ambulation/Gait assistance: Min guard;Min assist Gait Distance (Feet): 30 Feet Assistive device: Rolling walker (2 wheeled) Gait Pattern/deviations: Step-to pattern;Decreased stride length Gait velocity: decrease   General Gait Details: to [facility] then door and back. limited by general fatigue and LE weakness.   Stairs             Wheelchair Mobility    Modified Rankin (Stroke Patients Only)       Balance Overall balance assessment: Needs assistance   Sitting balance-Leahy Scale: Fair     Standing balance support: Bilateral upper extremity supported Standing balance-Leahy Scale:  Fair Standing balance comment: Pt requires assistance through UE support and FWW to remain upright.                            Cognition Arousal/Alertness: Awake/alert Behavior During Therapy: WFL for tasks assessed/performed Overall Cognitive Status: Within Functional Limits for tasks assessed                                        Exercises      General Comments        Pertinent Vitals/Pain Pain Assessment: Faces Faces Pain Scale: Hurts a little bit Pain Location: Bilateral soreness of feet. Pain Descriptors / Indicators: Sore;Discomfort Pain Intervention(s): Limited activity within patient's tolerance;Monitored during session;Repositioned    Home Living                      Prior Function            PT Goals (current goals can now be found in the care plan section) Progress towards PT goals: Progressing toward goals    Frequency    Min 2X/week      PT Plan      Co-evaluation              AM-PAC PT "6 Clicks" Mobility  Outcome Measure  Help needed turning from your back to your side while in a flat bed without using bedrails?: A Little Help needed moving from lying on your back to sitting on the side of a flat bed without using bedrails?: A Little Help needed moving to and from a bed to a chair (including a wheelchair)?: A Little Help needed standing up from a chair using your arms (e.g., wheelchair or bedside chair)?: A Little Help needed to walk in hospital room?: A Little Help needed climbing 3-5 steps with a railing? : A Lot 6 Click Score: 17    End of Session   Activity Tolerance: Patient limited by fatigue Patient left: in bed;with call bell/phone within reach;with nursing/sitter in room Nurse Communication: Mobility status PT Visit Diagnosis: Unsteadiness on feet (R26.81);Other abnormalities of gait and mobility (R26.89);Repeated falls (R29.6);Muscle weakness (generalized) (M62.81);History of falling  (Z91.81);Difficulty in walking, not elsewhere classified (R26.2);Pain Pain - part of body: Ankle and joints of foot     Time: 3875-6433 PT Time Calculation (min) (ACUTE ONLY): 18 min  Charges:  $Gait Training: 8-22 mins                    Danielle Dess, PTA 06/05/20, 12:05 PM

## 2020-06-08 DIAGNOSIS — I471 Supraventricular tachycardia: Secondary | ICD-10-CM | POA: Diagnosis not present

## 2020-06-08 DIAGNOSIS — N1831 Chronic kidney disease, stage 3a: Secondary | ICD-10-CM | POA: Diagnosis not present

## 2020-06-08 DIAGNOSIS — R296 Repeated falls: Secondary | ICD-10-CM | POA: Diagnosis not present

## 2020-06-08 DIAGNOSIS — I824Y2 Acute embolism and thrombosis of unspecified deep veins of left proximal lower extremity: Secondary | ICD-10-CM | POA: Diagnosis not present

## 2020-06-08 DIAGNOSIS — E1122 Type 2 diabetes mellitus with diabetic chronic kidney disease: Secondary | ICD-10-CM | POA: Diagnosis not present

## 2020-06-26 DIAGNOSIS — E1122 Type 2 diabetes mellitus with diabetic chronic kidney disease: Secondary | ICD-10-CM | POA: Diagnosis not present

## 2020-06-26 DIAGNOSIS — H548 Legal blindness, as defined in USA: Secondary | ICD-10-CM | POA: Diagnosis not present

## 2020-06-26 DIAGNOSIS — I429 Cardiomyopathy, unspecified: Secondary | ICD-10-CM | POA: Diagnosis not present

## 2020-06-26 DIAGNOSIS — I119 Hypertensive heart disease without heart failure: Secondary | ICD-10-CM | POA: Diagnosis not present

## 2020-06-26 DIAGNOSIS — I82412 Acute embolism and thrombosis of left femoral vein: Secondary | ICD-10-CM | POA: Diagnosis not present

## 2020-06-26 DIAGNOSIS — M419 Scoliosis, unspecified: Secondary | ICD-10-CM | POA: Diagnosis not present

## 2020-06-26 DIAGNOSIS — I82812 Embolism and thrombosis of superficial veins of left lower extremities: Secondary | ICD-10-CM | POA: Diagnosis not present

## 2020-06-26 DIAGNOSIS — S7002XD Contusion of left hip, subsequent encounter: Secondary | ICD-10-CM | POA: Diagnosis not present

## 2020-06-26 DIAGNOSIS — N1831 Chronic kidney disease, stage 3a: Secondary | ICD-10-CM | POA: Diagnosis not present

## 2020-06-29 DIAGNOSIS — N1831 Chronic kidney disease, stage 3a: Secondary | ICD-10-CM | POA: Diagnosis not present

## 2020-06-29 DIAGNOSIS — I82412 Acute embolism and thrombosis of left femoral vein: Secondary | ICD-10-CM | POA: Diagnosis not present

## 2020-06-29 DIAGNOSIS — S7002XD Contusion of left hip, subsequent encounter: Secondary | ICD-10-CM | POA: Diagnosis not present

## 2020-06-29 DIAGNOSIS — I119 Hypertensive heart disease without heart failure: Secondary | ICD-10-CM | POA: Diagnosis not present

## 2020-06-29 DIAGNOSIS — M419 Scoliosis, unspecified: Secondary | ICD-10-CM | POA: Diagnosis not present

## 2020-06-29 DIAGNOSIS — I82812 Embolism and thrombosis of superficial veins of left lower extremities: Secondary | ICD-10-CM | POA: Diagnosis not present

## 2020-06-29 DIAGNOSIS — H548 Legal blindness, as defined in USA: Secondary | ICD-10-CM | POA: Diagnosis not present

## 2020-06-29 DIAGNOSIS — I429 Cardiomyopathy, unspecified: Secondary | ICD-10-CM | POA: Diagnosis not present

## 2020-06-29 DIAGNOSIS — E1122 Type 2 diabetes mellitus with diabetic chronic kidney disease: Secondary | ICD-10-CM | POA: Diagnosis not present

## 2020-07-01 DIAGNOSIS — E1122 Type 2 diabetes mellitus with diabetic chronic kidney disease: Secondary | ICD-10-CM | POA: Diagnosis not present

## 2020-07-01 DIAGNOSIS — N1831 Chronic kidney disease, stage 3a: Secondary | ICD-10-CM | POA: Diagnosis not present

## 2020-07-01 DIAGNOSIS — S7002XD Contusion of left hip, subsequent encounter: Secondary | ICD-10-CM | POA: Diagnosis not present

## 2020-07-01 DIAGNOSIS — I82812 Embolism and thrombosis of superficial veins of left lower extremities: Secondary | ICD-10-CM | POA: Diagnosis not present

## 2020-07-01 DIAGNOSIS — I429 Cardiomyopathy, unspecified: Secondary | ICD-10-CM | POA: Diagnosis not present

## 2020-07-01 DIAGNOSIS — I119 Hypertensive heart disease without heart failure: Secondary | ICD-10-CM | POA: Diagnosis not present

## 2020-07-01 DIAGNOSIS — M419 Scoliosis, unspecified: Secondary | ICD-10-CM | POA: Diagnosis not present

## 2020-07-01 DIAGNOSIS — I82412 Acute embolism and thrombosis of left femoral vein: Secondary | ICD-10-CM | POA: Diagnosis not present

## 2020-07-01 DIAGNOSIS — H548 Legal blindness, as defined in USA: Secondary | ICD-10-CM | POA: Diagnosis not present

## 2020-07-03 DIAGNOSIS — S7002XD Contusion of left hip, subsequent encounter: Secondary | ICD-10-CM | POA: Diagnosis not present

## 2020-07-03 DIAGNOSIS — I429 Cardiomyopathy, unspecified: Secondary | ICD-10-CM | POA: Diagnosis not present

## 2020-07-03 DIAGNOSIS — E1122 Type 2 diabetes mellitus with diabetic chronic kidney disease: Secondary | ICD-10-CM | POA: Diagnosis not present

## 2020-07-03 DIAGNOSIS — I82412 Acute embolism and thrombosis of left femoral vein: Secondary | ICD-10-CM | POA: Diagnosis not present

## 2020-07-03 DIAGNOSIS — N1831 Chronic kidney disease, stage 3a: Secondary | ICD-10-CM | POA: Diagnosis not present

## 2020-07-03 DIAGNOSIS — H548 Legal blindness, as defined in USA: Secondary | ICD-10-CM | POA: Diagnosis not present

## 2020-07-03 DIAGNOSIS — I119 Hypertensive heart disease without heart failure: Secondary | ICD-10-CM | POA: Diagnosis not present

## 2020-07-03 DIAGNOSIS — M419 Scoliosis, unspecified: Secondary | ICD-10-CM | POA: Diagnosis not present

## 2020-07-03 DIAGNOSIS — I82812 Embolism and thrombosis of superficial veins of left lower extremities: Secondary | ICD-10-CM | POA: Diagnosis not present

## 2020-07-06 DIAGNOSIS — I82812 Embolism and thrombosis of superficial veins of left lower extremities: Secondary | ICD-10-CM | POA: Diagnosis not present

## 2020-07-06 DIAGNOSIS — I429 Cardiomyopathy, unspecified: Secondary | ICD-10-CM | POA: Diagnosis not present

## 2020-07-06 DIAGNOSIS — E1122 Type 2 diabetes mellitus with diabetic chronic kidney disease: Secondary | ICD-10-CM | POA: Diagnosis not present

## 2020-07-06 DIAGNOSIS — I82412 Acute embolism and thrombosis of left femoral vein: Secondary | ICD-10-CM | POA: Diagnosis not present

## 2020-07-06 DIAGNOSIS — M419 Scoliosis, unspecified: Secondary | ICD-10-CM | POA: Diagnosis not present

## 2020-07-06 DIAGNOSIS — H548 Legal blindness, as defined in USA: Secondary | ICD-10-CM | POA: Diagnosis not present

## 2020-07-06 DIAGNOSIS — I119 Hypertensive heart disease without heart failure: Secondary | ICD-10-CM | POA: Diagnosis not present

## 2020-07-06 DIAGNOSIS — S7002XD Contusion of left hip, subsequent encounter: Secondary | ICD-10-CM | POA: Diagnosis not present

## 2020-07-06 DIAGNOSIS — N1831 Chronic kidney disease, stage 3a: Secondary | ICD-10-CM | POA: Diagnosis not present

## 2020-07-07 DIAGNOSIS — I82812 Embolism and thrombosis of superficial veins of left lower extremities: Secondary | ICD-10-CM | POA: Diagnosis not present

## 2020-07-07 DIAGNOSIS — I429 Cardiomyopathy, unspecified: Secondary | ICD-10-CM | POA: Diagnosis not present

## 2020-07-07 DIAGNOSIS — I119 Hypertensive heart disease without heart failure: Secondary | ICD-10-CM | POA: Diagnosis not present

## 2020-07-07 DIAGNOSIS — M419 Scoliosis, unspecified: Secondary | ICD-10-CM | POA: Diagnosis not present

## 2020-07-07 DIAGNOSIS — N1831 Chronic kidney disease, stage 3a: Secondary | ICD-10-CM | POA: Diagnosis not present

## 2020-07-07 DIAGNOSIS — H548 Legal blindness, as defined in USA: Secondary | ICD-10-CM | POA: Diagnosis not present

## 2020-07-07 DIAGNOSIS — I82412 Acute embolism and thrombosis of left femoral vein: Secondary | ICD-10-CM | POA: Diagnosis not present

## 2020-07-07 DIAGNOSIS — E1122 Type 2 diabetes mellitus with diabetic chronic kidney disease: Secondary | ICD-10-CM | POA: Diagnosis not present

## 2020-07-07 DIAGNOSIS — S7002XD Contusion of left hip, subsequent encounter: Secondary | ICD-10-CM | POA: Diagnosis not present

## 2020-07-08 DIAGNOSIS — M419 Scoliosis, unspecified: Secondary | ICD-10-CM | POA: Diagnosis not present

## 2020-07-08 DIAGNOSIS — I82812 Embolism and thrombosis of superficial veins of left lower extremities: Secondary | ICD-10-CM | POA: Diagnosis not present

## 2020-07-08 DIAGNOSIS — I429 Cardiomyopathy, unspecified: Secondary | ICD-10-CM | POA: Diagnosis not present

## 2020-07-08 DIAGNOSIS — S7002XD Contusion of left hip, subsequent encounter: Secondary | ICD-10-CM | POA: Diagnosis not present

## 2020-07-08 DIAGNOSIS — N1831 Chronic kidney disease, stage 3a: Secondary | ICD-10-CM | POA: Diagnosis not present

## 2020-07-08 DIAGNOSIS — I119 Hypertensive heart disease without heart failure: Secondary | ICD-10-CM | POA: Diagnosis not present

## 2020-07-08 DIAGNOSIS — H548 Legal blindness, as defined in USA: Secondary | ICD-10-CM | POA: Diagnosis not present

## 2020-07-08 DIAGNOSIS — E1122 Type 2 diabetes mellitus with diabetic chronic kidney disease: Secondary | ICD-10-CM | POA: Diagnosis not present

## 2020-07-08 DIAGNOSIS — I82412 Acute embolism and thrombosis of left femoral vein: Secondary | ICD-10-CM | POA: Diagnosis not present

## 2020-07-09 DIAGNOSIS — H548 Legal blindness, as defined in USA: Secondary | ICD-10-CM | POA: Diagnosis not present

## 2020-07-09 DIAGNOSIS — E1122 Type 2 diabetes mellitus with diabetic chronic kidney disease: Secondary | ICD-10-CM | POA: Diagnosis not present

## 2020-07-09 DIAGNOSIS — N1831 Chronic kidney disease, stage 3a: Secondary | ICD-10-CM | POA: Diagnosis not present

## 2020-07-09 DIAGNOSIS — S7002XD Contusion of left hip, subsequent encounter: Secondary | ICD-10-CM | POA: Diagnosis not present

## 2020-07-09 DIAGNOSIS — I82812 Embolism and thrombosis of superficial veins of left lower extremities: Secondary | ICD-10-CM | POA: Diagnosis not present

## 2020-07-09 DIAGNOSIS — I119 Hypertensive heart disease without heart failure: Secondary | ICD-10-CM | POA: Diagnosis not present

## 2020-07-09 DIAGNOSIS — I429 Cardiomyopathy, unspecified: Secondary | ICD-10-CM | POA: Diagnosis not present

## 2020-07-09 DIAGNOSIS — M419 Scoliosis, unspecified: Secondary | ICD-10-CM | POA: Diagnosis not present

## 2020-07-09 DIAGNOSIS — I82412 Acute embolism and thrombosis of left femoral vein: Secondary | ICD-10-CM | POA: Diagnosis not present

## 2020-07-13 DIAGNOSIS — I429 Cardiomyopathy, unspecified: Secondary | ICD-10-CM | POA: Diagnosis not present

## 2020-07-13 DIAGNOSIS — S7002XD Contusion of left hip, subsequent encounter: Secondary | ICD-10-CM | POA: Diagnosis not present

## 2020-07-13 DIAGNOSIS — I82412 Acute embolism and thrombosis of left femoral vein: Secondary | ICD-10-CM | POA: Diagnosis not present

## 2020-07-13 DIAGNOSIS — H548 Legal blindness, as defined in USA: Secondary | ICD-10-CM | POA: Diagnosis not present

## 2020-07-13 DIAGNOSIS — N1831 Chronic kidney disease, stage 3a: Secondary | ICD-10-CM | POA: Diagnosis not present

## 2020-07-13 DIAGNOSIS — E1122 Type 2 diabetes mellitus with diabetic chronic kidney disease: Secondary | ICD-10-CM | POA: Diagnosis not present

## 2020-07-13 DIAGNOSIS — I119 Hypertensive heart disease without heart failure: Secondary | ICD-10-CM | POA: Diagnosis not present

## 2020-07-13 DIAGNOSIS — M419 Scoliosis, unspecified: Secondary | ICD-10-CM | POA: Diagnosis not present

## 2020-07-13 DIAGNOSIS — I82812 Embolism and thrombosis of superficial veins of left lower extremities: Secondary | ICD-10-CM | POA: Diagnosis not present

## 2020-07-14 DIAGNOSIS — H548 Legal blindness, as defined in USA: Secondary | ICD-10-CM | POA: Diagnosis not present

## 2020-07-14 DIAGNOSIS — I82812 Embolism and thrombosis of superficial veins of left lower extremities: Secondary | ICD-10-CM | POA: Diagnosis not present

## 2020-07-14 DIAGNOSIS — I429 Cardiomyopathy, unspecified: Secondary | ICD-10-CM | POA: Diagnosis not present

## 2020-07-14 DIAGNOSIS — I82412 Acute embolism and thrombosis of left femoral vein: Secondary | ICD-10-CM | POA: Diagnosis not present

## 2020-07-14 DIAGNOSIS — S7002XD Contusion of left hip, subsequent encounter: Secondary | ICD-10-CM | POA: Diagnosis not present

## 2020-07-14 DIAGNOSIS — M419 Scoliosis, unspecified: Secondary | ICD-10-CM | POA: Diagnosis not present

## 2020-07-14 DIAGNOSIS — I119 Hypertensive heart disease without heart failure: Secondary | ICD-10-CM | POA: Diagnosis not present

## 2020-07-14 DIAGNOSIS — E1122 Type 2 diabetes mellitus with diabetic chronic kidney disease: Secondary | ICD-10-CM | POA: Diagnosis not present

## 2020-07-14 DIAGNOSIS — N1831 Chronic kidney disease, stage 3a: Secondary | ICD-10-CM | POA: Diagnosis not present

## 2020-07-15 DIAGNOSIS — M419 Scoliosis, unspecified: Secondary | ICD-10-CM | POA: Diagnosis not present

## 2020-07-15 DIAGNOSIS — I82412 Acute embolism and thrombosis of left femoral vein: Secondary | ICD-10-CM | POA: Diagnosis not present

## 2020-07-15 DIAGNOSIS — H548 Legal blindness, as defined in USA: Secondary | ICD-10-CM | POA: Diagnosis not present

## 2020-07-15 DIAGNOSIS — I429 Cardiomyopathy, unspecified: Secondary | ICD-10-CM | POA: Diagnosis not present

## 2020-07-15 DIAGNOSIS — N1831 Chronic kidney disease, stage 3a: Secondary | ICD-10-CM | POA: Diagnosis not present

## 2020-07-15 DIAGNOSIS — S7002XD Contusion of left hip, subsequent encounter: Secondary | ICD-10-CM | POA: Diagnosis not present

## 2020-07-15 DIAGNOSIS — I82812 Embolism and thrombosis of superficial veins of left lower extremities: Secondary | ICD-10-CM | POA: Diagnosis not present

## 2020-07-15 DIAGNOSIS — I119 Hypertensive heart disease without heart failure: Secondary | ICD-10-CM | POA: Diagnosis not present

## 2020-07-15 DIAGNOSIS — E1122 Type 2 diabetes mellitus with diabetic chronic kidney disease: Secondary | ICD-10-CM | POA: Diagnosis not present

## 2020-07-16 DIAGNOSIS — I119 Hypertensive heart disease without heart failure: Secondary | ICD-10-CM | POA: Diagnosis not present

## 2020-07-16 DIAGNOSIS — I429 Cardiomyopathy, unspecified: Secondary | ICD-10-CM | POA: Diagnosis not present

## 2020-07-16 DIAGNOSIS — H548 Legal blindness, as defined in USA: Secondary | ICD-10-CM | POA: Diagnosis not present

## 2020-07-16 DIAGNOSIS — I82412 Acute embolism and thrombosis of left femoral vein: Secondary | ICD-10-CM | POA: Diagnosis not present

## 2020-07-16 DIAGNOSIS — N1831 Chronic kidney disease, stage 3a: Secondary | ICD-10-CM | POA: Diagnosis not present

## 2020-07-16 DIAGNOSIS — M419 Scoliosis, unspecified: Secondary | ICD-10-CM | POA: Diagnosis not present

## 2020-07-16 DIAGNOSIS — E1122 Type 2 diabetes mellitus with diabetic chronic kidney disease: Secondary | ICD-10-CM | POA: Diagnosis not present

## 2020-07-16 DIAGNOSIS — S7002XD Contusion of left hip, subsequent encounter: Secondary | ICD-10-CM | POA: Diagnosis not present

## 2020-07-16 DIAGNOSIS — I82812 Embolism and thrombosis of superficial veins of left lower extremities: Secondary | ICD-10-CM | POA: Diagnosis not present

## 2020-07-20 DIAGNOSIS — I82812 Embolism and thrombosis of superficial veins of left lower extremities: Secondary | ICD-10-CM | POA: Diagnosis not present

## 2020-07-20 DIAGNOSIS — I82412 Acute embolism and thrombosis of left femoral vein: Secondary | ICD-10-CM | POA: Diagnosis not present

## 2020-07-20 DIAGNOSIS — H548 Legal blindness, as defined in USA: Secondary | ICD-10-CM | POA: Diagnosis not present

## 2020-07-20 DIAGNOSIS — I119 Hypertensive heart disease without heart failure: Secondary | ICD-10-CM | POA: Diagnosis not present

## 2020-07-20 DIAGNOSIS — E1122 Type 2 diabetes mellitus with diabetic chronic kidney disease: Secondary | ICD-10-CM | POA: Diagnosis not present

## 2020-07-20 DIAGNOSIS — M419 Scoliosis, unspecified: Secondary | ICD-10-CM | POA: Diagnosis not present

## 2020-07-20 DIAGNOSIS — I429 Cardiomyopathy, unspecified: Secondary | ICD-10-CM | POA: Diagnosis not present

## 2020-07-20 DIAGNOSIS — S7002XD Contusion of left hip, subsequent encounter: Secondary | ICD-10-CM | POA: Diagnosis not present

## 2020-07-20 DIAGNOSIS — N1831 Chronic kidney disease, stage 3a: Secondary | ICD-10-CM | POA: Diagnosis not present

## 2020-07-21 DIAGNOSIS — N1831 Chronic kidney disease, stage 3a: Secondary | ICD-10-CM | POA: Diagnosis not present

## 2020-07-21 DIAGNOSIS — I119 Hypertensive heart disease without heart failure: Secondary | ICD-10-CM | POA: Diagnosis not present

## 2020-07-21 DIAGNOSIS — H548 Legal blindness, as defined in USA: Secondary | ICD-10-CM | POA: Diagnosis not present

## 2020-07-21 DIAGNOSIS — I82412 Acute embolism and thrombosis of left femoral vein: Secondary | ICD-10-CM | POA: Diagnosis not present

## 2020-07-21 DIAGNOSIS — I429 Cardiomyopathy, unspecified: Secondary | ICD-10-CM | POA: Diagnosis not present

## 2020-07-21 DIAGNOSIS — E1122 Type 2 diabetes mellitus with diabetic chronic kidney disease: Secondary | ICD-10-CM | POA: Diagnosis not present

## 2020-07-21 DIAGNOSIS — I82812 Embolism and thrombosis of superficial veins of left lower extremities: Secondary | ICD-10-CM | POA: Diagnosis not present

## 2020-07-21 DIAGNOSIS — S7002XD Contusion of left hip, subsequent encounter: Secondary | ICD-10-CM | POA: Diagnosis not present

## 2020-07-21 DIAGNOSIS — M419 Scoliosis, unspecified: Secondary | ICD-10-CM | POA: Diagnosis not present

## 2020-07-24 DIAGNOSIS — E1122 Type 2 diabetes mellitus with diabetic chronic kidney disease: Secondary | ICD-10-CM | POA: Diagnosis not present

## 2020-07-24 DIAGNOSIS — I82412 Acute embolism and thrombosis of left femoral vein: Secondary | ICD-10-CM | POA: Diagnosis not present

## 2020-07-24 DIAGNOSIS — I82812 Embolism and thrombosis of superficial veins of left lower extremities: Secondary | ICD-10-CM | POA: Diagnosis not present

## 2020-07-24 DIAGNOSIS — S7002XD Contusion of left hip, subsequent encounter: Secondary | ICD-10-CM | POA: Diagnosis not present

## 2020-07-24 DIAGNOSIS — I429 Cardiomyopathy, unspecified: Secondary | ICD-10-CM | POA: Diagnosis not present

## 2020-07-24 DIAGNOSIS — N1831 Chronic kidney disease, stage 3a: Secondary | ICD-10-CM | POA: Diagnosis not present

## 2020-07-24 DIAGNOSIS — I119 Hypertensive heart disease without heart failure: Secondary | ICD-10-CM | POA: Diagnosis not present

## 2020-07-24 DIAGNOSIS — H548 Legal blindness, as defined in USA: Secondary | ICD-10-CM | POA: Diagnosis not present

## 2020-07-24 DIAGNOSIS — M419 Scoliosis, unspecified: Secondary | ICD-10-CM | POA: Diagnosis not present

## 2020-07-27 DIAGNOSIS — S7002XD Contusion of left hip, subsequent encounter: Secondary | ICD-10-CM | POA: Diagnosis not present

## 2020-07-27 DIAGNOSIS — I82812 Embolism and thrombosis of superficial veins of left lower extremities: Secondary | ICD-10-CM | POA: Diagnosis not present

## 2020-07-27 DIAGNOSIS — E1122 Type 2 diabetes mellitus with diabetic chronic kidney disease: Secondary | ICD-10-CM | POA: Diagnosis not present

## 2020-07-27 DIAGNOSIS — I429 Cardiomyopathy, unspecified: Secondary | ICD-10-CM | POA: Diagnosis not present

## 2020-07-27 DIAGNOSIS — N1831 Chronic kidney disease, stage 3a: Secondary | ICD-10-CM | POA: Diagnosis not present

## 2020-07-27 DIAGNOSIS — M419 Scoliosis, unspecified: Secondary | ICD-10-CM | POA: Diagnosis not present

## 2020-07-27 DIAGNOSIS — H548 Legal blindness, as defined in USA: Secondary | ICD-10-CM | POA: Diagnosis not present

## 2020-07-27 DIAGNOSIS — I82412 Acute embolism and thrombosis of left femoral vein: Secondary | ICD-10-CM | POA: Diagnosis not present

## 2020-07-27 DIAGNOSIS — I119 Hypertensive heart disease without heart failure: Secondary | ICD-10-CM | POA: Diagnosis not present

## 2020-07-29 DIAGNOSIS — I119 Hypertensive heart disease without heart failure: Secondary | ICD-10-CM | POA: Diagnosis not present

## 2020-07-29 DIAGNOSIS — N1831 Chronic kidney disease, stage 3a: Secondary | ICD-10-CM | POA: Diagnosis not present

## 2020-07-29 DIAGNOSIS — H548 Legal blindness, as defined in USA: Secondary | ICD-10-CM | POA: Diagnosis not present

## 2020-07-29 DIAGNOSIS — M419 Scoliosis, unspecified: Secondary | ICD-10-CM | POA: Diagnosis not present

## 2020-07-29 DIAGNOSIS — S7002XD Contusion of left hip, subsequent encounter: Secondary | ICD-10-CM | POA: Diagnosis not present

## 2020-07-29 DIAGNOSIS — I429 Cardiomyopathy, unspecified: Secondary | ICD-10-CM | POA: Diagnosis not present

## 2020-07-29 DIAGNOSIS — I82412 Acute embolism and thrombosis of left femoral vein: Secondary | ICD-10-CM | POA: Diagnosis not present

## 2020-07-29 DIAGNOSIS — E1122 Type 2 diabetes mellitus with diabetic chronic kidney disease: Secondary | ICD-10-CM | POA: Diagnosis not present

## 2020-07-29 DIAGNOSIS — I82812 Embolism and thrombosis of superficial veins of left lower extremities: Secondary | ICD-10-CM | POA: Diagnosis not present

## 2020-08-13 DIAGNOSIS — S7002XD Contusion of left hip, subsequent encounter: Secondary | ICD-10-CM | POA: Diagnosis not present

## 2020-08-13 DIAGNOSIS — H548 Legal blindness, as defined in USA: Secondary | ICD-10-CM | POA: Diagnosis not present

## 2020-08-13 DIAGNOSIS — I429 Cardiomyopathy, unspecified: Secondary | ICD-10-CM | POA: Diagnosis not present

## 2020-08-13 DIAGNOSIS — E1122 Type 2 diabetes mellitus with diabetic chronic kidney disease: Secondary | ICD-10-CM | POA: Diagnosis not present

## 2020-08-13 DIAGNOSIS — N1831 Chronic kidney disease, stage 3a: Secondary | ICD-10-CM | POA: Diagnosis not present

## 2020-08-13 DIAGNOSIS — M419 Scoliosis, unspecified: Secondary | ICD-10-CM | POA: Diagnosis not present

## 2020-08-13 DIAGNOSIS — I82412 Acute embolism and thrombosis of left femoral vein: Secondary | ICD-10-CM | POA: Diagnosis not present

## 2020-08-13 DIAGNOSIS — I82812 Embolism and thrombosis of superficial veins of left lower extremities: Secondary | ICD-10-CM | POA: Diagnosis not present

## 2020-08-13 DIAGNOSIS — I119 Hypertensive heart disease without heart failure: Secondary | ICD-10-CM | POA: Diagnosis not present

## 2020-09-16 DIAGNOSIS — E1122 Type 2 diabetes mellitus with diabetic chronic kidney disease: Secondary | ICD-10-CM | POA: Diagnosis not present

## 2020-09-16 DIAGNOSIS — N1831 Chronic kidney disease, stage 3a: Secondary | ICD-10-CM | POA: Diagnosis not present

## 2021-03-11 DIAGNOSIS — I43 Cardiomyopathy in diseases classified elsewhere: Secondary | ICD-10-CM | POA: Diagnosis not present

## 2021-03-11 DIAGNOSIS — Z0000 Encounter for general adult medical examination without abnormal findings: Secondary | ICD-10-CM | POA: Diagnosis not present

## 2021-03-11 DIAGNOSIS — E1122 Type 2 diabetes mellitus with diabetic chronic kidney disease: Secondary | ICD-10-CM | POA: Diagnosis not present

## 2021-03-11 DIAGNOSIS — N1831 Chronic kidney disease, stage 3a: Secondary | ICD-10-CM | POA: Diagnosis not present

## 2021-03-11 DIAGNOSIS — I471 Supraventricular tachycardia: Secondary | ICD-10-CM | POA: Diagnosis not present

## 2021-03-11 DIAGNOSIS — I119 Hypertensive heart disease without heart failure: Secondary | ICD-10-CM | POA: Diagnosis not present

## 2021-10-07 DIAGNOSIS — I43 Cardiomyopathy in diseases classified elsewhere: Secondary | ICD-10-CM | POA: Diagnosis not present

## 2021-10-07 DIAGNOSIS — I471 Supraventricular tachycardia: Secondary | ICD-10-CM | POA: Diagnosis not present

## 2021-10-07 DIAGNOSIS — N1831 Chronic kidney disease, stage 3a: Secondary | ICD-10-CM | POA: Diagnosis not present

## 2021-10-07 DIAGNOSIS — Z0000 Encounter for general adult medical examination without abnormal findings: Secondary | ICD-10-CM | POA: Diagnosis not present

## 2021-10-07 DIAGNOSIS — E782 Mixed hyperlipidemia: Secondary | ICD-10-CM | POA: Diagnosis not present

## 2021-10-07 DIAGNOSIS — I1310 Hypertensive heart and chronic kidney disease without heart failure, with stage 1 through stage 4 chronic kidney disease, or unspecified chronic kidney disease: Secondary | ICD-10-CM | POA: Diagnosis not present

## 2021-10-07 DIAGNOSIS — E1122 Type 2 diabetes mellitus with diabetic chronic kidney disease: Secondary | ICD-10-CM | POA: Diagnosis not present

## 2022-03-07 IMAGING — CT CT HEAD W/O CM
3 series · 14 of 47 positions shown, 16 images · non-contrast
Comparison: None.

CLINICAL DATA: Unwitnessed fall.

EXAM:
CT HEAD WITHOUT CONTRAST
TECHNIQUE: Contiguous axial images were obtained from the base of the skull
through the vertex without intravenous contrast.

[Series 2: head wo · axial · 0.42mm/px · z∈[+122,+267]mm · 8 of 35 slices shown, 10 images]
[im 3/35  brain]
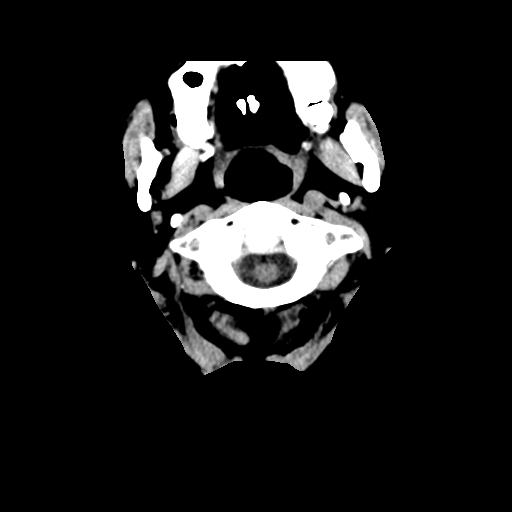
[im 3/35  bone]
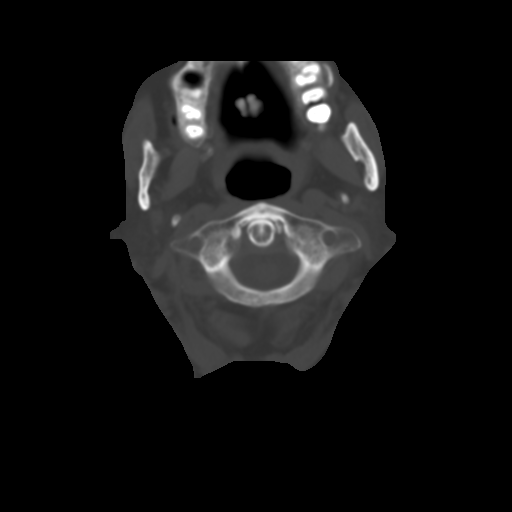
[im 8/35  brain]
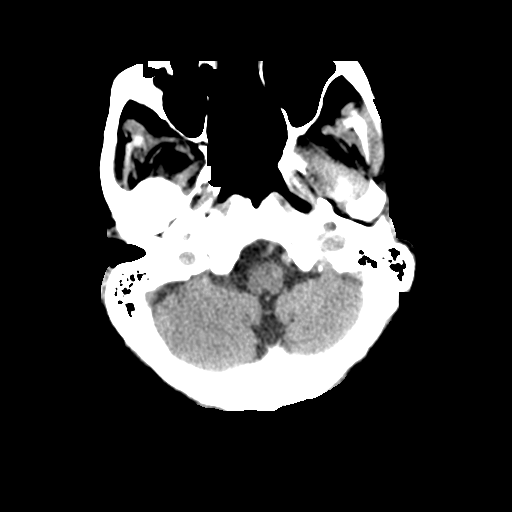
[im 11/35  brain]
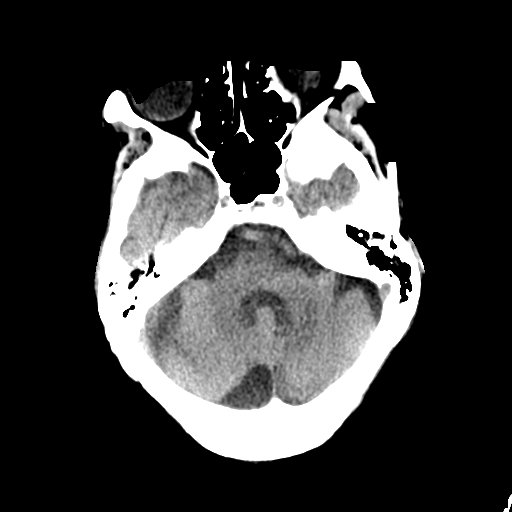
[im 16/35  brain]
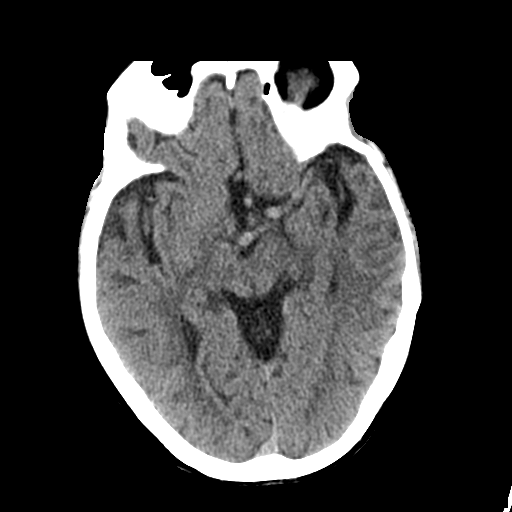
[im 19/35  brain]
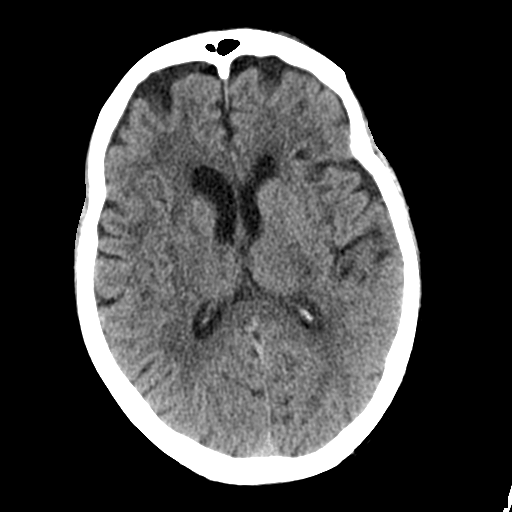
[im 19/35  bone]
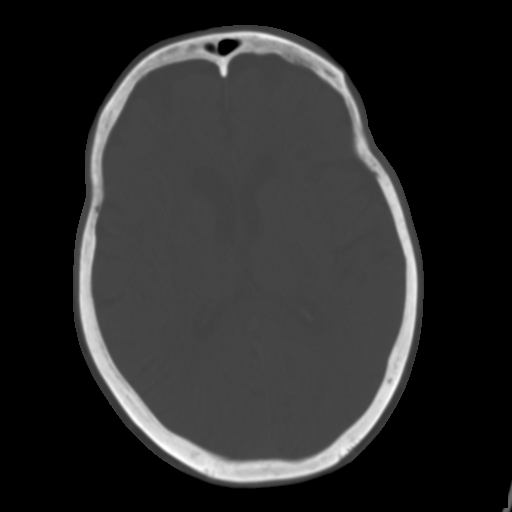
[im 24/35  brain]
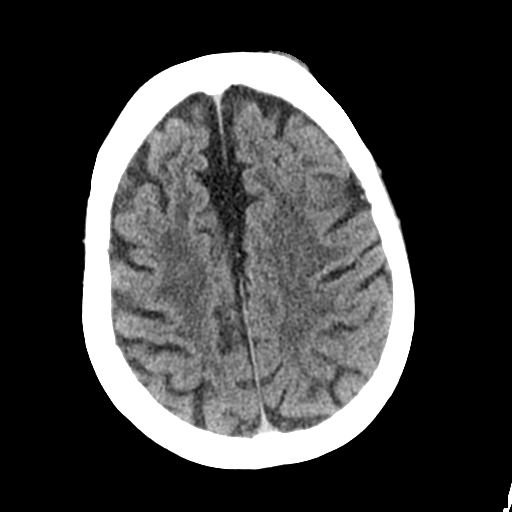
[im 27/35  brain]
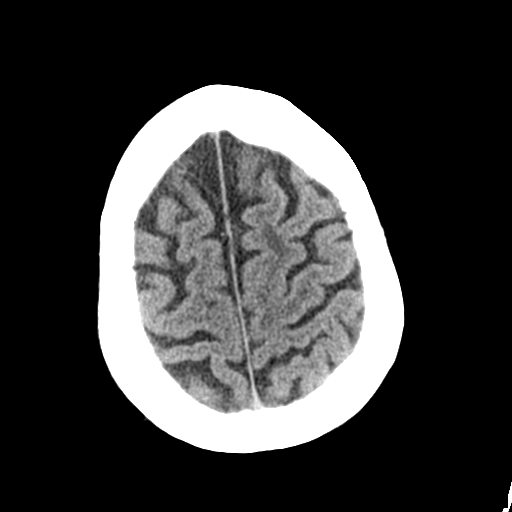
[im 32/35  brain]
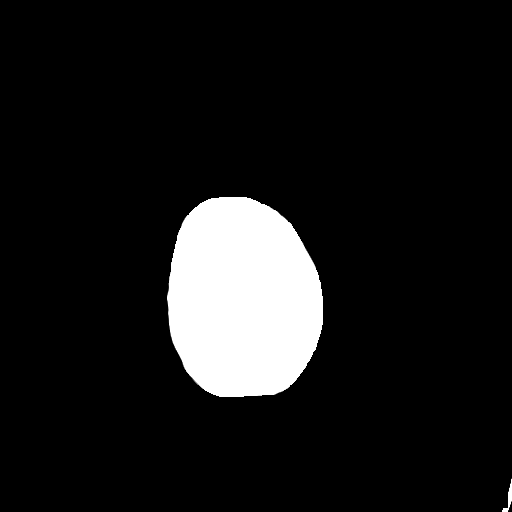

[Series 4: coronal soft tissue · coronal · 0.34mm/px · 3 of 84 slices shown]
[im 28/84  brain]
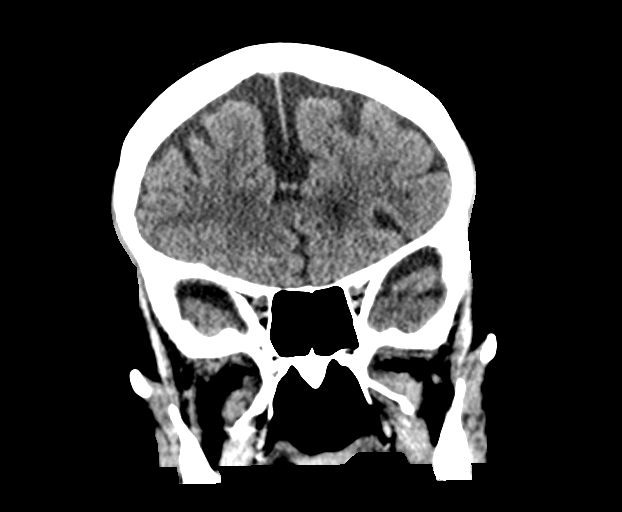
[im 37/84  brain]
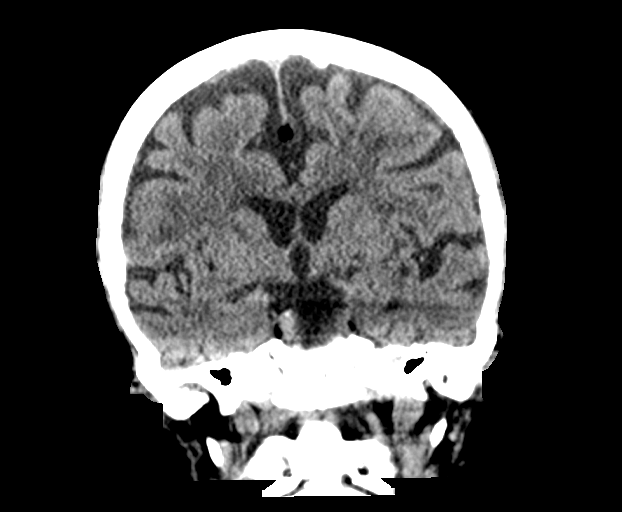
[im 47/84  brain]
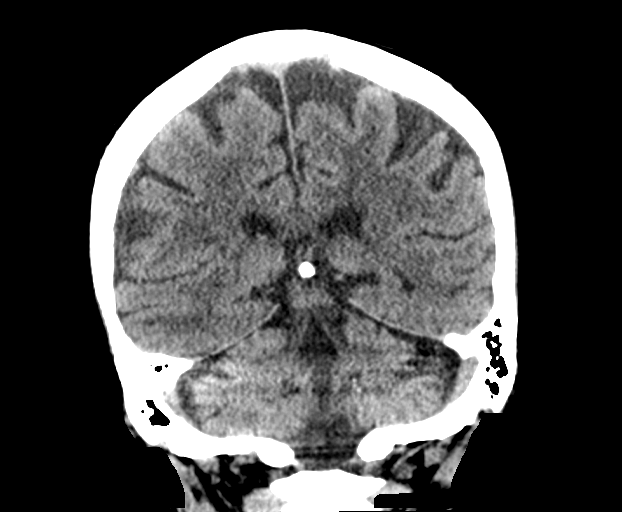

[Series 5: sagittal soft tissue · sagittal · 0.34mm/px · 3 of 84 slices shown]
[im 28/84  brain]
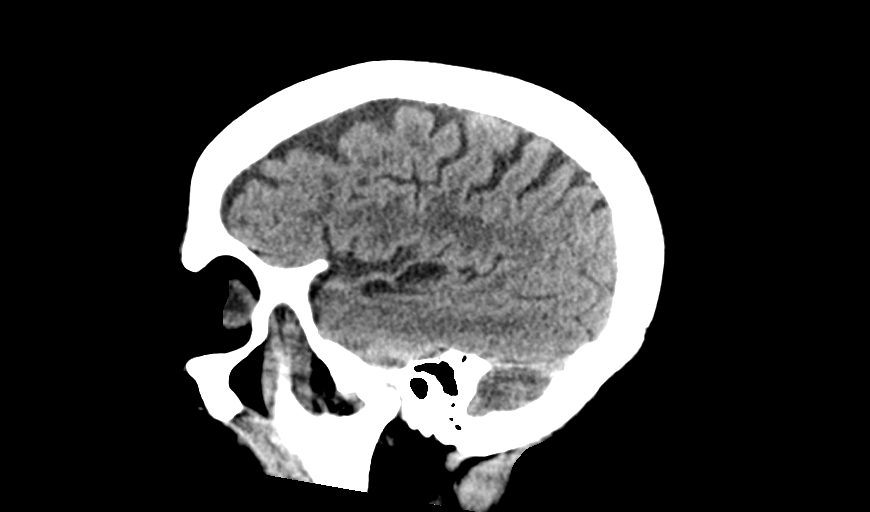
[im 42/84  brain]
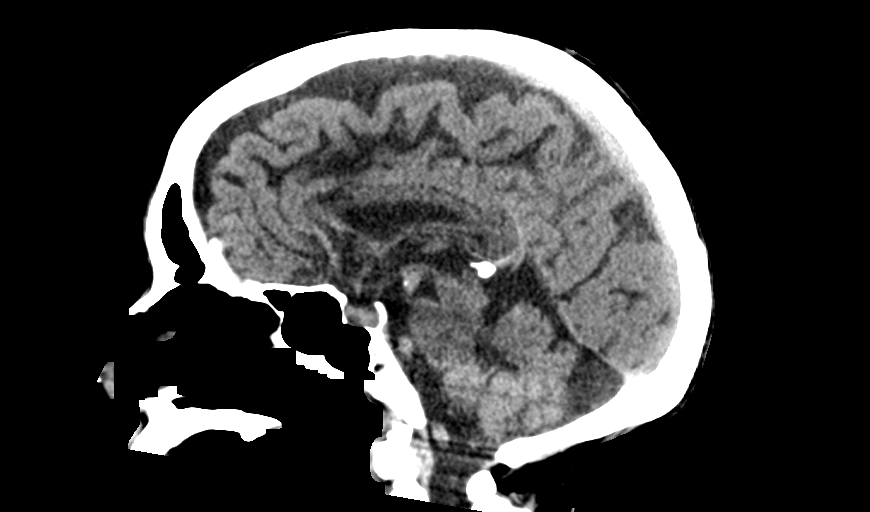
[im 56/84  brain]
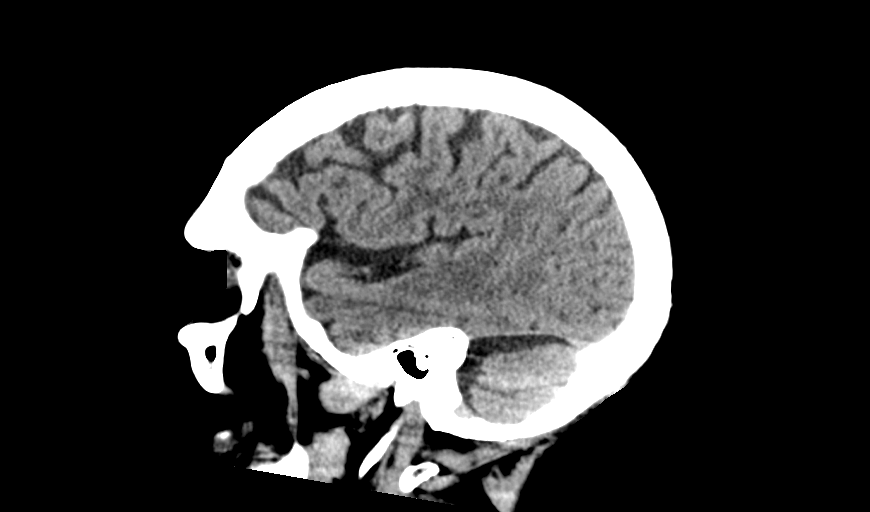

[14 of 47 positions shown; findings below may reference images not displayed]

FINDINGS: Brain: No intracranial hemorrhage, mass effect, or midline shift.
Generalized atrophy, normal for age. No hydrocephalus. The basilar
cisterns are patent. Moderate periventricular and deep white matter
hypodensity typical of chronic small vessel ischemia. Suspect small
periventricular lacunar infarct in the left frontal lobe. No
evidence of territorial infarct or acute ischemia. No extra-axial or
intracranial fluid collection. Small incidental falx lipoma.

Vascular: Atherosclerosis of skullbase vasculature without
hyperdense vessel or abnormal calcification.

Skull: No fracture or focal lesion.

Sinuses/Orbits: Paranasal sinuses and mastoid air cells are clear.
The visualized orbits are unremarkable. Bilateral cataract
resection.

Other: Small left frontal scalp contusion.
IMPRESSION: 1. Small left frontal scalp contusion. No acute intracranial
abnormality. No skull fracture.
2. Age related atrophy and chronic small vessel ischemia.

## 2022-04-19 DIAGNOSIS — E782 Mixed hyperlipidemia: Secondary | ICD-10-CM | POA: Diagnosis not present

## 2022-04-19 DIAGNOSIS — N1831 Chronic kidney disease, stage 3a: Secondary | ICD-10-CM | POA: Diagnosis not present

## 2022-04-19 DIAGNOSIS — I471 Supraventricular tachycardia, unspecified: Secondary | ICD-10-CM | POA: Diagnosis not present

## 2022-04-19 DIAGNOSIS — E1122 Type 2 diabetes mellitus with diabetic chronic kidney disease: Secondary | ICD-10-CM | POA: Diagnosis not present

## 2022-04-19 DIAGNOSIS — Z8679 Personal history of other diseases of the circulatory system: Secondary | ICD-10-CM | POA: Diagnosis not present

## 2022-04-19 DIAGNOSIS — Z0000 Encounter for general adult medical examination without abnormal findings: Secondary | ICD-10-CM | POA: Diagnosis not present

## 2022-04-19 DIAGNOSIS — I119 Hypertensive heart disease without heart failure: Secondary | ICD-10-CM | POA: Diagnosis not present

## 2022-04-19 DIAGNOSIS — I43 Cardiomyopathy in diseases classified elsewhere: Secondary | ICD-10-CM | POA: Diagnosis not present

## 2022-08-31 DIAGNOSIS — Z0100 Encounter for examination of eyes and vision without abnormal findings: Secondary | ICD-10-CM | POA: Diagnosis not present

## 2022-08-31 DIAGNOSIS — H353122 Nonexudative age-related macular degeneration, left eye, intermediate dry stage: Secondary | ICD-10-CM | POA: Diagnosis not present

## 2022-08-31 DIAGNOSIS — H353112 Nonexudative age-related macular degeneration, right eye, intermediate dry stage: Secondary | ICD-10-CM | POA: Diagnosis not present

## 2022-08-31 DIAGNOSIS — H353190 Nonexudative age-related macular degeneration, unspecified eye, stage unspecified: Secondary | ICD-10-CM | POA: Diagnosis not present

## 2022-08-31 DIAGNOSIS — H353130 Nonexudative age-related macular degeneration, bilateral, stage unspecified: Secondary | ICD-10-CM | POA: Diagnosis not present

## 2022-08-31 DIAGNOSIS — H401132 Primary open-angle glaucoma, bilateral, moderate stage: Secondary | ICD-10-CM | POA: Diagnosis not present

## 2022-12-13 DIAGNOSIS — I119 Hypertensive heart disease without heart failure: Secondary | ICD-10-CM | POA: Diagnosis not present

## 2022-12-13 DIAGNOSIS — Z0000 Encounter for general adult medical examination without abnormal findings: Secondary | ICD-10-CM | POA: Diagnosis not present

## 2022-12-13 DIAGNOSIS — N1831 Chronic kidney disease, stage 3a: Secondary | ICD-10-CM | POA: Diagnosis not present

## 2022-12-13 DIAGNOSIS — I43 Cardiomyopathy in diseases classified elsewhere: Secondary | ICD-10-CM | POA: Diagnosis not present

## 2022-12-13 DIAGNOSIS — E782 Mixed hyperlipidemia: Secondary | ICD-10-CM | POA: Diagnosis not present

## 2022-12-13 DIAGNOSIS — Z1331 Encounter for screening for depression: Secondary | ICD-10-CM | POA: Diagnosis not present

## 2022-12-13 DIAGNOSIS — E1122 Type 2 diabetes mellitus with diabetic chronic kidney disease: Secondary | ICD-10-CM | POA: Diagnosis not present

## 2022-12-13 DIAGNOSIS — R300 Dysuria: Secondary | ICD-10-CM | POA: Diagnosis not present

## 2022-12-16 DIAGNOSIS — Z1331 Encounter for screening for depression: Secondary | ICD-10-CM | POA: Diagnosis not present

## 2022-12-16 DIAGNOSIS — R300 Dysuria: Secondary | ICD-10-CM | POA: Diagnosis not present

## 2022-12-16 DIAGNOSIS — E1122 Type 2 diabetes mellitus with diabetic chronic kidney disease: Secondary | ICD-10-CM | POA: Diagnosis not present

## 2022-12-16 DIAGNOSIS — I119 Hypertensive heart disease without heart failure: Secondary | ICD-10-CM | POA: Diagnosis not present

## 2022-12-16 DIAGNOSIS — N1831 Chronic kidney disease, stage 3a: Secondary | ICD-10-CM | POA: Diagnosis not present

## 2022-12-16 DIAGNOSIS — E782 Mixed hyperlipidemia: Secondary | ICD-10-CM | POA: Diagnosis not present

## 2022-12-16 DIAGNOSIS — I43 Cardiomyopathy in diseases classified elsewhere: Secondary | ICD-10-CM | POA: Diagnosis not present

## 2022-12-16 DIAGNOSIS — Z0000 Encounter for general adult medical examination without abnormal findings: Secondary | ICD-10-CM | POA: Diagnosis not present

## 2023-03-09 ENCOUNTER — Inpatient Hospital Stay
Admission: EM | Admit: 2023-03-09 | Discharge: 2023-03-11 | DRG: 690 | Disposition: A | Discharge status: Hospice | Payer: Medicare HMO | Attending: Internal Medicine | Admitting: Internal Medicine

## 2023-03-09 ENCOUNTER — Emergency Department: Payer: Medicare HMO

## 2023-03-09 ENCOUNTER — Other Ambulatory Visit: Payer: Self-pay

## 2023-03-09 ENCOUNTER — Inpatient Hospital Stay: Payer: Medicare HMO

## 2023-03-09 DIAGNOSIS — R531 Weakness: Secondary | ICD-10-CM | POA: Diagnosis not present

## 2023-03-09 DIAGNOSIS — L89316 Pressure-induced deep tissue damage of right buttock: Secondary | ICD-10-CM | POA: Diagnosis not present

## 2023-03-09 DIAGNOSIS — E1122 Type 2 diabetes mellitus with diabetic chronic kidney disease: Secondary | ICD-10-CM | POA: Diagnosis not present

## 2023-03-09 DIAGNOSIS — N179 Acute kidney failure, unspecified: Secondary | ICD-10-CM | POA: Diagnosis present

## 2023-03-09 DIAGNOSIS — R627 Adult failure to thrive: Secondary | ICD-10-CM | POA: Diagnosis present

## 2023-03-09 DIAGNOSIS — B9689 Other specified bacterial agents as the cause of diseases classified elsewhere: Secondary | ICD-10-CM | POA: Diagnosis present

## 2023-03-09 DIAGNOSIS — Z86718 Personal history of other venous thrombosis and embolism: Secondary | ICD-10-CM | POA: Diagnosis not present

## 2023-03-09 DIAGNOSIS — Z7901 Long term (current) use of anticoagulants: Secondary | ICD-10-CM

## 2023-03-09 DIAGNOSIS — I129 Hypertensive chronic kidney disease with stage 1 through stage 4 chronic kidney disease, or unspecified chronic kidney disease: Secondary | ICD-10-CM | POA: Diagnosis present

## 2023-03-09 DIAGNOSIS — Z66 Do not resuscitate: Secondary | ICD-10-CM | POA: Diagnosis present

## 2023-03-09 DIAGNOSIS — N183 Chronic kidney disease, stage 3 unspecified: Secondary | ICD-10-CM | POA: Diagnosis not present

## 2023-03-09 DIAGNOSIS — I6782 Cerebral ischemia: Secondary | ICD-10-CM | POA: Diagnosis not present

## 2023-03-09 DIAGNOSIS — N1832 Chronic kidney disease, stage 3b: Secondary | ICD-10-CM | POA: Diagnosis present

## 2023-03-09 DIAGNOSIS — L89156 Pressure-induced deep tissue damage of sacral region: Secondary | ICD-10-CM | POA: Diagnosis not present

## 2023-03-09 DIAGNOSIS — Z1624 Resistance to multiple antibiotics: Secondary | ICD-10-CM | POA: Diagnosis present

## 2023-03-09 DIAGNOSIS — L8990 Pressure ulcer of unspecified site, unspecified stage: Principal | ICD-10-CM

## 2023-03-09 DIAGNOSIS — L89326 Pressure-induced deep tissue damage of left buttock: Secondary | ICD-10-CM | POA: Diagnosis present

## 2023-03-09 DIAGNOSIS — E1169 Type 2 diabetes mellitus with other specified complication: Secondary | ICD-10-CM

## 2023-03-09 DIAGNOSIS — R0902 Hypoxemia: Secondary | ICD-10-CM | POA: Diagnosis not present

## 2023-03-09 DIAGNOSIS — R0989 Other specified symptoms and signs involving the circulatory and respiratory systems: Secondary | ICD-10-CM | POA: Diagnosis not present

## 2023-03-09 DIAGNOSIS — Z1152 Encounter for screening for COVID-19: Secondary | ICD-10-CM

## 2023-03-09 DIAGNOSIS — N390 Urinary tract infection, site not specified: Secondary | ICD-10-CM | POA: Diagnosis not present

## 2023-03-09 DIAGNOSIS — R410 Disorientation, unspecified: Secondary | ICD-10-CM | POA: Diagnosis not present

## 2023-03-09 DIAGNOSIS — I429 Cardiomyopathy, unspecified: Secondary | ICD-10-CM | POA: Diagnosis not present

## 2023-03-09 DIAGNOSIS — I82409 Acute embolism and thrombosis of unspecified deep veins of unspecified lower extremity: Secondary | ICD-10-CM | POA: Diagnosis present

## 2023-03-09 DIAGNOSIS — L98429 Non-pressure chronic ulcer of back with unspecified severity: Secondary | ICD-10-CM | POA: Insufficient documentation

## 2023-03-09 DIAGNOSIS — R4182 Altered mental status, unspecified: Secondary | ICD-10-CM | POA: Diagnosis not present

## 2023-03-09 LAB — COMPREHENSIVE METABOLIC PANEL
ALT: 26 U/L (ref 0–44)
AST: 37 U/L (ref 15–41)
Albumin: 3.5 g/dL (ref 3.5–5.0)
Alkaline Phosphatase: 55 U/L (ref 38–126)
Anion gap: 12 (ref 5–15)
BUN: 36 mg/dL — ABNORMAL HIGH (ref 8–23)
CO2: 19 mmol/L — ABNORMAL LOW (ref 22–32)
Calcium: 9.2 mg/dL (ref 8.9–10.3)
Chloride: 105 mmol/L (ref 98–111)
Creatinine, Ser: 1.32 mg/dL — ABNORMAL HIGH (ref 0.44–1.00)
GFR, Estimated: 38 mL/min — ABNORMAL LOW (ref >60)
Glucose, Bld: 144 mg/dL — ABNORMAL HIGH (ref 70–99)
Potassium: 4.2 mmol/L (ref 3.5–5.1)
Sodium: 136 mmol/L (ref 135–145)
Total Bilirubin: 0.8 mg/dL (ref <1.2)
Total Protein: 7.6 g/dL (ref 6.5–8.1)

## 2023-03-09 LAB — URINALYSIS, MICROSCOPIC (REFLEX)
RBC / HPF: >50 RBC/hpf (ref 0–5)
Squamous Epithelial / HPF: NONE SEEN /[HPF] (ref 0–5)
WBC, UA: >50 WBC/hpf (ref 0–5)

## 2023-03-09 LAB — CBC WITH DIFFERENTIAL/PLATELET
Abs Immature Granulocytes: 0.05 10*3/uL (ref 0.00–0.07)
Basophils Absolute: 0.1 10*3/uL (ref 0.0–0.1)
Basophils Relative: 1 %
Eosinophils Absolute: 0.2 10*3/uL (ref 0.0–0.5)
Eosinophils Relative: 2 %
HCT: 43.2 % (ref 36.0–46.0)
Hemoglobin: 14 g/dL (ref 12.0–15.0)
Immature Granulocytes: 1 %
Lymphocytes Relative: 24 %
Lymphs Abs: 2.4 10*3/uL (ref 0.7–4.0)
MCH: 31.8 pg (ref 26.0–34.0)
MCHC: 32.4 g/dL (ref 30.0–36.0)
MCV: 98.2 fL (ref 80.0–100.0)
Monocytes Absolute: 1 10*3/uL (ref 0.1–1.0)
Monocytes Relative: 10 %
Neutro Abs: 6.5 10*3/uL (ref 1.7–7.7)
Neutrophils Relative %: 62 %
Platelets: 223 10*3/uL (ref 150–400)
RBC: 4.4 MIL/uL (ref 3.87–5.11)
RDW: 14.3 % (ref 11.5–15.5)
WBC: 10.2 10*3/uL (ref 4.0–10.5)
nRBC: 0 % (ref 0.0–0.2)

## 2023-03-09 LAB — URINALYSIS, ROUTINE W REFLEX MICROSCOPIC
Bilirubin Urine: NEGATIVE
Bilirubin Urine: NEGATIVE
Glucose, UA: NEGATIVE mg/dL
Glucose, UA: NEGATIVE mg/dL
Ketones, ur: NEGATIVE mg/dL
Ketones, ur: NEGATIVE mg/dL
Nitrite: NEGATIVE
Nitrite: NEGATIVE
Protein, ur: 100 mg/dL — AB
Protein, ur: 100 mg/dL — AB
Specific Gravity, Urine: 1.015 (ref 1.005–1.030)
Specific Gravity, Urine: 1.015 (ref 1.005–1.030)
pH: 7 (ref 5.0–8.0)
pH: 7 (ref 5.0–8.0)

## 2023-03-09 LAB — RESP PANEL BY RT-PCR (RSV, FLU A&B, COVID)  RVPGX2
Influenza A by PCR: NEGATIVE
Influenza B by PCR: NEGATIVE
Resp Syncytial Virus by PCR: NEGATIVE
SARS Coronavirus 2 by RT PCR: NEGATIVE

## 2023-03-09 LAB — CK: Total CK: 25 U/L — ABNORMAL LOW (ref 38–234)

## 2023-03-09 LAB — T4, FREE: Free T4: 1.01 ng/dL (ref 0.61–1.12)

## 2023-03-09 LAB — TROPONIN I (HIGH SENSITIVITY): Troponin I (High Sensitivity): 9 ng/L (ref <18)

## 2023-03-09 LAB — TSH: TSH: 5.264 u[IU]/mL — ABNORMAL HIGH (ref 0.350–4.500)

## 2023-03-09 MED ORDER — ONDANSETRON HCL 4 MG/2ML IJ SOLN: 4.0000 mg | Freq: Four times a day (QID) | INTRAMUSCULAR | Status: DC | PRN

## 2023-03-09 MED ORDER — SODIUM CHLORIDE 0.9 % IV BOLUS
500.0000 mL | Freq: Once | INTRAVENOUS | Status: AC
  Administered 2023-03-09: 500 mL via INTRAVENOUS

## 2023-03-09 MED ORDER — SODIUM CHLORIDE 0.9 % IV SOLN
1.0000 g | Freq: Once | INTRAVENOUS | Status: AC
Start: 2023-03-09 — End: 2023-03-09
  Administered 2023-03-09: 1 g via INTRAVENOUS
  Filled 2023-03-09: qty 10

## 2023-03-09 MED ORDER — ENOXAPARIN SODIUM 30 MG/0.3ML IJ SOSY: 30.0000 mg | PREFILLED_SYRINGE | INTRAMUSCULAR | Status: DC

## 2023-03-09 MED ORDER — ENOXAPARIN SODIUM 40 MG/0.4ML IJ SOSY: 40.0000 mg | PREFILLED_SYRINGE | INTRAMUSCULAR | Status: DC

## 2023-03-09 MED ORDER — ONDANSETRON HCL 4 MG PO TABS: 4.0000 mg | ORAL_TABLET | Freq: Four times a day (QID) | ORAL | Status: DC | PRN

## 2023-03-09 NOTE — TOC Initial Note (Signed)
Transition of Care [facility]) - Initial/Assessment Note    Patient Details  Name: Kristin Browning MRN: [id number] Date of Birth: 04/27/1929  [facility] CM/SW Contact:    Marquita Palms, LCSW Phone Number: 03/09/2023, 3:09 PM  Clinical Narrative:                  CSW met with patient and her daughter Kristin Browning bedside. Patients daughter reports that she is wants to speak with someone in hospice. Patient slightly confused. Patient daughter confirms PCP and pharmacy. Daughter and sister agreeable to hospice. Awaitig consult.      Patient Goals and CMS Choice            Expected Discharge Plan and Services                                              Prior Living Arrangements/Services                       Activities of Daily Living      Permission Sought/Granted                  Emotional Assessment              Admission diagnosis:  Weakness [R53.1] Patient Active Problem List   Diagnosis Date Noted   Weakness 03/09/2023   DVT (deep venous thrombosis) (HCC) 06/02/2020   Rhabdomyolysis 06/02/2020   AKI (acute kidney injury) (HCC) 06/01/2020   PCP:  Lauro Regulus, MD Pharmacy:   [facility] [phone] - [place], [place] - [address] [place] [address] Phone: [phone] Fax: [phone]     Social Drivers of Health (SDOH) Social History: SDOH Screenings   Food Insecurity: No Food Insecurity (12/13/2022)   Received from [facility]  Housing: Unknown (12/13/2022)   Received from [facility]  Transportation Needs: No Transportation Needs (12/13/2022)   Received from [facility]  Utilities: Not At Risk (12/13/2022)   Received from [facility]  Financial Resource Strain: Low Risk  (12/13/2022)   Received from [facility]  Tobacco Use: Low Risk  (04/19/2022)   Received from [facility]   SDOH  Interventions:     Readmission Risk Interventions     No data to display

## 2023-03-09 NOTE — Assessment & Plan Note (Addendum)
Progressive worsening weakness, decreased po intake, confusion over several months to > 1 year  Suspect progressive declining functional status with chronic medical conditions including type 2 DM, stage 3 CKD, and likely undiagnosed dementia CT head w/ atrophic changes  Will plan for formal PT/OT evaluation  Had lengthy discussion w/ daughter at the bedside  Will also consult palliative care to establish goals of care  Clinically dry  IVF hydration  Monitor

## 2023-03-09 NOTE — Progress Notes (Signed)
PHARMACIST - PHYSICIAN COMMUNICATION  CONCERNING:  Enoxaparin (Lovenox) for DVT Prophylaxis    RECOMMENDATION: Patient was prescribed enoxaprin 40mg  q24 hours for VTE prophylaxis.   Filed Weights   03/09/23 1755  Weight: 71.3 kg (157 lb 3 oz)    Body mass index is 27.85 kg/m.  Estimated Creatinine Clearance: 26.3 mL/min (A) (by C-G formula based on SCr of 1.32 mg/dL (H)).  Patient is candidate for enoxaparin 30mg  every 24 hours based on CrCl <59ml/min or Weight <45kg  DESCRIPTION: Pharmacy has adjusted enoxaparin dose per [facility] policy.  Patient is now receiving enoxaparin 30 mg every 24 hours    Merryl Hacker, PharmD Clinical Pharmacist  03/09/2023 6:01 PM

## 2023-03-09 NOTE — ED Triage Notes (Signed)
Patient to ED via [facility] from home for c/o leg weakness. Patient also complains of confusion in the last couple of weeks. Pt A&O x4  BP 153/100 HR 98 O2 93% room air Cbg 182

## 2023-03-09 NOTE — ED Notes (Signed)
Patient transported to CT 

## 2023-03-09 NOTE — Assessment & Plan Note (Signed)
Noted sacral pressure ulcer on evaluation Wound care consult Monitor

## 2023-03-09 NOTE — ED Notes (Signed)
Lab called to collect blood work.

## 2023-03-09 NOTE — Assessment & Plan Note (Signed)
Noted prior hx/o DVT  LE u/s ordered in ER  Monitor

## 2023-03-09 NOTE — Assessment & Plan Note (Signed)
Urinalysis indicative of infection Started on IV Rocephin in the ER Suspect asymptomatic bacteriuria Will continue for now pending urine culture Monitor

## 2023-03-09 NOTE — Consult Note (Signed)
WOC Nurse Consult Note: Reason for Consult: possible deep tissue pressure injury buttocks Patient from home with weakness  Wound type: Deep Tissue Pressure Injury; bilateral buttocks and sacrum. Presents as circular area; dark purple non blanchable area  Pressure Injury POA: Yes Measurement:see nursing notes  Wound JXB:JYNW purple, non blanchable  Drainage (amount, consistency, odor) none Periwound: intact  Dressing procedure/placement/frequency: Cover areas with xeroform, top with foam. Change every other day.  Low air loss mattress for moisture management and pressure redistribution   Discussed POC with patient and bedside nurse.  Re consult if needed, will not follow at this time. Thanks  Josedaniel Haye [employer], RN,CWOCN, CNS, CWON-AP [phone])

## 2023-03-09 NOTE — H&P (Signed)
History and Physical    Patient: Kristin Browning VZD:[id number] DOB: [birth year] DOA: 03/09/2023 DOS: the patient was seen and examined on 03/09/2023 PCP: Lauro Regulus, MD  Patient coming from: Home  Chief Complaint:  Chief Complaint  Patient presents with   Weakness   HPI: Kristin Browning is a 89 y.o. female with medical history significant of hypertension, cardiomyopathy, type 2 diabetes, stage III CKD presenting with weakness, failure to thrive.  History primarily from patient's daughter in the setting of generalized confusion.  Per report, patient with overall progressive decline over several months to greater than 1 year.  Patient with decreased eating, worsening confusion and weakness.  Has had inability to ambulate over multiple weeks.  No fevers or chills.  No nausea or vomiting.  Daughter found patient sitting in the doorway attempted to leave the house.  Has had significant difficulty eating and progressive weight loss.  No reported dysuria or increased urinary frequency. Presented to the ER afebrile, hemodynamically stable.  Satting well on room air.  White count 10.2, hemoglobin 14, platelets 223, urinalysis leukocyte positive.  CK20 5.  COVID flu and RSV negative.  Platelet count 223. Review of Systems: As mentioned in the history of present illness. All other systems reviewed and are negative. No past medical history on file.  Social History:  has no history on file for tobacco use, alcohol use, and drug use.  Not on File  No family history on file.  Prior to Admission medications   Medication Sig Start Date End Date Taking? Authorizing Provider  apixaban (ELIQUIS) 5 MG TABS tablet Take 2 tablets (10 mg total) by mouth 2 (two) times daily for 10 doses. 06/05/20 06/10/20  Gillis Santa, MD  apixaban (ELIQUIS) 5 MG TABS tablet Take 1 tablet (5 mg total) by mouth 2 (two) times daily. Start first dose at night on 05/13/20 06/10/20   Gillis Santa, MD  diltiazem (DILACOR XR) 240 MG 24  hr capsule Take 240 mg by mouth daily.    [provider]  hydrOXYzine (ATARAX/VISTARIL) 25 MG tablet Take 1 tablet (25 mg total) by mouth 3 (three) times daily as needed for anxiety. 06/05/20   Gillis Santa, MD  Multiple Vitamins-Minerals (PRESERVISION AREDS 2+MULTI VIT) CAPS Take 1 capsule by mouth daily.    [provider]  oxybutynin (DITROPAN-XL) 10 MG 24 hr tablet Take 10 mg by mouth daily. 02/01/20   [provider]  timolol (TIMOPTIC) 0.5 % ophthalmic solution Place 1 drop into both eyes daily.    [provider]  traZODone (DESYREL) 50 MG tablet Take 0.5 tablets (25 mg total) by mouth at bedtime as needed for sleep. 06/05/20   Gillis Santa, MD    Physical Exam: Vitals:   03/09/23 0909 03/09/23 1215 03/09/23 1230  BP: (!) 149/90 102/76 125/88  Pulse: 81 76 73  Resp: 16 19 20   Temp: 98.2 F (36.8 C)    TempSrc: Oral    SpO2: 92% 91% 92%   Physical Exam Constitutional:      Comments: underweight  HENT:     Head: Normocephalic and atraumatic.     Mouth/Throat:     Mouth: Mucous membranes are dry.  Cardiovascular:     Rate and Rhythm: Normal rate and regular rhythm.  Pulmonary:     Effort: Pulmonary effort is normal.  Abdominal:     General: Bowel sounds are normal.  Musculoskeletal:        General: Normal range of motion.  Skin:  General: Skin is dry.  Neurological:     General: No focal deficit present.     Comments: Mild generalized confusion    Psychiatric:        Mood and Affect: Mood normal.     Data Reviewed:  There are no new results to review at this time.  US Venous Img Lower Bilateral CLINICAL DATA:  Bilateral lower extremity pain, history of DVT  EXAM: BILATERAL LOWER EXTREMITY VENOUS DOPPLER ULTRASOUND  TECHNIQUE: Gray-scale sonography with graded compression, as well as color Doppler and duplex ultrasound were performed to evaluate the lower extremity deep venous systems from the level of the common  femoral vein and including the common femoral, femoral, profunda femoral, popliteal and calf veins including the posterior tibial, peroneal and gastrocnemius veins when visible. The superficial great saphenous vein was also interrogated. Spectral Doppler was utilized to evaluate flow at rest and with distal augmentation maneuvers in the common femoral, femoral and popliteal veins.  COMPARISON:  None Available.  FINDINGS: RIGHT LOWER EXTREMITY  Common Femoral Vein: No evidence of thrombus. Normal compressibility, respiratory phasicity and response to augmentation.  Saphenofemoral Junction: No evidence of thrombus. Normal compressibility and flow on color Doppler imaging.  Profunda Femoral Vein: No evidence of thrombus. Normal compressibility and flow on color Doppler imaging.  Femoral Vein: No evidence of thrombus. Normal compressibility, respiratory phasicity and response to augmentation.  Popliteal Vein: No evidence of thrombus. Normal compressibility, respiratory phasicity and response to augmentation.  Calf Veins: No evidence of thrombus. Normal compressibility and flow on color Doppler imaging.  Superficial Great Saphenous Vein: No evidence of thrombus. Normal compressibility.  Venous Reflux:  None.  Other Findings:  None.  LEFT LOWER EXTREMITY  Common Femoral Vein: No evidence of thrombus. Normal compressibility, respiratory phasicity and response to augmentation.  Saphenofemoral Junction: No evidence of thrombus. Normal compressibility and flow on color Doppler imaging.  Profunda Femoral Vein: No evidence of thrombus. Normal compressibility and flow on color Doppler imaging.  Femoral Vein: The femoral vein is noncompressible in the left mid thigh. No evidence of color flow in the segment. Additionally, there is some associated eccentric wall thickening. Findings suggest acute on chronic DVT.  Popliteal Vein: No evidence of thrombus. Normal  compressibility, respiratory phasicity and response to augmentation.  Calf Veins: No evidence of thrombus. Normal compressibility and flow on color Doppler imaging.  Superficial Great Saphenous Vein: No evidence of thrombus. Normal compressibility.  Venous Reflux:  None.  Other Findings:  None.  IMPRESSION: 1. Positive for probable acute on chronic DVT within the femoral vein in the mid aspect of the left thigh. 2. No evidence of acute DVT in the right lower extremity.  Electronically Signed   By: Malachy Moan M.D.   On: 03/09/2023 16:44 DG Chest Portable 1 View CLINICAL DATA:  Weakness, confusion  EXAM: PORTABLE CHEST 1 VIEW  COMPARISON:  06/01/2020  FINDINGS: Single frontal view of the chest was obtained, with the patient rotated toward the left. Cardiac silhouette is unremarkable. Continued ectasia and atherosclerosis of the thoracic aorta, accentuated by patient rotation. Lung volumes are diminished, without acute airspace disease, effusion, or pneumothorax. No acute bony abnormalities.  IMPRESSION: 1. Low lung volumes.  No acute process.  Electronically Signed   By: Sharlet Salina M.D.   On: 03/09/2023 11:12 CT HEAD WO CONTRAST ( ) CLINICAL DATA:  Mental status change with unknown cause  EXAM: CT HEAD WITHOUT CONTRAST  TECHNIQUE: Contiguous axial images were obtained from the base of the  skull through the vertex without intravenous contrast.  RADIATION DOSE REDUCTION: This exam was performed according to the departmental dose-optimization program which includes automated exposure control, adjustment of the mA and/or kV according to patient size and/or use of iterative reconstruction technique.  COMPARISON:  06/01/2020  FINDINGS: Brain: No evidence of acute infarction, hemorrhage, hydrocephalus, extra-axial collection or mass lesion/mass effect. Chronic small vessel ischemia in the cerebral white matter. Stable thickening of the falx  including fatty metaplasia. Generalized atrophy.  Vascular: No hyperdense vessel or unexpected calcification.  Skull: Normal. Negative for fracture or focal lesion.  Sinuses/Orbits: No acute finding.  IMPRESSION: 1. No acute finding. 2. Atrophy and chronic small vessel ischemia which appears similar to 2022.  Electronically Signed   By: Tiburcio Pea M.D.   On: 03/09/2023 10:38  Lab Results  Component Value Date   WBC 10.2 03/09/2023   HGB 14.0 03/09/2023   HCT 43.2 03/09/2023   MCV 98.2 03/09/2023   PLT 223 03/09/2023   Last metabolic panel Lab Results  Component Value Date   GLUCOSE 144 (H) 03/09/2023   NA 136 03/09/2023   K 4.2 03/09/2023   CL 105 03/09/2023   CO2 19 (L) 03/09/2023   BUN 36 (H) 03/09/2023   CREATININE 1.32 (H) 03/09/2023   GFRNONAA 38 (L) 03/09/2023   CALCIUM 9.2 03/09/2023   PHOS 2.6 06/05/2020   PROT 7.6 03/09/2023   ALBUMIN 3.5 03/09/2023   BILITOT 0.8 03/09/2023   ALKPHOS 55 03/09/2023   AST 37 03/09/2023   ALT 26 03/09/2023   ANIONGAP 12 03/09/2023    Assessment and Plan: * Weakness Progressive worsening weakness, decreased po intake, confusion over several months to > 1 year  Suspect progressive declining functional status with chronic medical conditions including type 2 DM, stage 3 CKD, and likely undiagnosed dementia CT head w/ atrophic changes  Will plan for formal PT/OT evaluation  Had lengthy discussion w/ daughter at the bedside  Will also consult palliative care to establish goals of care  Clinically dry  IVF hydration  Monitor   Sacral ulcer (HCC) Noted sacral pressure ulcer on evaluation Wound care consult Monitor  UTI (urinary tract infection) Urinalysis indicative of infection Started on IV Rocephin in the ER Suspect asymptomatic bacteriuria Will continue for now pending urine culture Monitor  CKD (chronic kidney disease) stage 3, GFR 30-59 ml/min (HCC) Cr 1.3 w/ GFR in the 30s At baseline  Mildly dry   Gentle IVF hydration  Monitor    DVT (deep venous thrombosis) (HCC) Noted prior hx/o DVT  LE u/s ordered in ER  Monitor        Advance Care Planning:   Code Status: Limited: Do not attempt resuscitation (DNR) -DNR-LIMITED -Do Not Intubate/DNI    Consults: Palliative Care   Family Communication: Daughter at the bedside   Severity of Illness: The appropriate patient status for this patient is INPATIENT. Inpatient status is judged to be reasonable and necessary in order to provide the required intensity of service to ensure the patient's safety. The patient's presenting symptoms, physical exam findings, and initial radiographic and laboratory data in the context of their chronic comorbidities is felt to place them at high risk for further clinical deterioration. Furthermore, it is not anticipated that the patient will be medically stable for discharge from the hospital within 2 midnights of admission.   * I certify that at the point of admission it is my clinical judgment that the patient will require inpatient hospital care spanning beyond  2 midnights from the point of admission due to high intensity of service, high risk for further deterioration and high frequency of surveillance required.*  Author: Floydene Flock, MD 03/09/2023 3:49 PM  For on call review [email or web address].

## 2023-03-09 NOTE — Progress Notes (Signed)
[facility] ED 04 [facility] hospital liaison note:  Referral received to discuss hospice services with family who has questions.   Placed call to patient's daughter Graciella Belton and message left for return call.   Will update when more information is available.   Thank your for the opportunity to participate in this patient's care Thea Gist, BSN, RN Hospice hospital liaison [phone]

## 2023-03-09 NOTE — Progress Notes (Deleted)
IV team consulted for PIV access. Upon arriving to room, pt had been transported to CT. RN to place consult when pt arrives back to room.

## 2023-03-09 NOTE — ED Provider Notes (Addendum)
[facility] Provider Note    None    (approximate)   History   Weakness   HPI  Kristin Browning is a 89 y.o. female with type 2 diabetes, cardiomyopathy, CKD who comes in with concerns for weakness.  I reviewed patient's office visit from 12/13/2022.  According to family she has been more confused over the past few weeks but over the past few days she has been having difficulty standing up and walking due to weakness.  She denies any chest pain, abdominal pain, shortness of breath or other concerns.  She states that she is difficult to stand up.  Denies any known falls.   Physical Exam   Triage Vital Signs: ED Triage Vitals  Encounter Vitals Group     BP      Systolic BP Percentile      Diastolic BP Percentile      Pulse      Resp      Temp      Temp src      SpO2      Weight      Height      Head Circumference      Peak Flow      Pain Score      Pain Loc      Pain Education      Exclude from Growth Chart     Most recent vital signs: Vitals:   03/09/23 0909  BP: (!) 149/90  Pulse: 81  Resp: 16  Temp: 98.2 F (36.8 C)  SpO2: 92%     General: Awake, no distress.  CV:  Good peripheral perfusion.  Resp:  Normal effort.  Abd:  No distention.  Other:  Alert and oriented x 3.  Able to lift both legs up off the bed.  Abdomen soft nontender   ED Results / Procedures / Treatments   Labs (all labs ordered are listed, but only abnormal results are displayed) Labs Reviewed  RESP PANEL BY RT-PCR (RSV, FLU A&B, COVID)  [id number]  CBC WITH DIFFERENTIAL/PLATELET  COMPREHENSIVE METABOLIC PANEL  CK  URINALYSIS, ROUTINE W REFLEX MICROSCOPIC  TSH  T4, FREE  TROPONIN I (HIGH SENSITIVITY)     EKG  My interpretation of EKG:  Normal sinus rate of 83 without any ST elevation, T wave version in lead III aVF with left bundle branch block.  Looks similar left bundle branch block on EKG from 2022  RADIOLOGY I have reviewed the xray personally and  interpreted and no evidence of any pneumonia   PROCEDURES:  Critical Care performed: No  .1-3 Lead EKG Interpretation  Performed by: Concha Se, MD Authorized by: Concha Se, MD     Interpretation: normal     ECG rate:  70   ECG rate assessment: normal     Rhythm: sinus rhythm     Ectopy: none     Conduction: normal      MEDICATIONS ORDERED IN ED: Medications  sodium chloride 0.9 % bolus 500 mL (has no administration in time range)     IMPRESSION / MDM / ASSESSMENT AND PLAN / ED COURSE  I reviewed the triage vital signs and the nursing notes.   Patient's presentation is most consistent with acute presentation with potential threat to life or bodily function.   Patient comes in with weakness.  No obvious new swelling to the legs noted.  The fluid is and evaluate for AKI, rhabdo  Normal CBC reassuring.  CMP shows slightly elevated creatinine up from her baseline.  I reviewed her prior creatinine on 12/13/2022 is 1.1  CT head negative  Patient has significant bedsore noted and wound care consult have been placed.  Patient is on able to take a single step in the room with the nurses.  I discussed with family and they are having significant difficult taking care of her.  I will discuss with hospitalist for admission for new AKI PT OT, wound care consult.  I did add on some ultrasound to make sure no DVT given she does have some slightly low oxygen levels although she denies any shortness of breath and x-ray, COVID were negative and DVT ultrasounds are positive may need to be worked up for PE.  I discussed with lab and there have been having some difficulties with their UA machine but they are going to cross it over that she did have greater than 50 WBCs, RBCs many bacteria with WBC clumps therefore this looks concerning for UTI and added on ceftriaxone   The patient is on the cardiac monitor to evaluate for evidence of arrhythmia and/or significant heart rate  changes.     FINAL CLINICAL IMPRESSION(S) / ED DIAGNOSES   Final diagnoses:  Pressure injury of skin, unspecified injury stage, unspecified location  AKI (acute kidney injury) (HCC)  Urinary tract infection without hematuria, site unspecified     Rx / DC Orders   ED Discharge Orders     None        Note:  This document was prepared using Dragon voice recognition software and may include unintentional dictation errors.   Concha Se, MD 03/09/23 1423    Concha Se, MD 03/09/23 [phone]

## 2023-03-09 NOTE — Assessment & Plan Note (Signed)
Cr 1.3 w/ GFR in the 30s At baseline  Mildly dry  Gentle IVF hydration  Monitor

## 2023-03-10 DIAGNOSIS — R531 Weakness: Secondary | ICD-10-CM | POA: Diagnosis not present

## 2023-03-10 MED ORDER — ENOXAPARIN SODIUM 30 MG/0.3ML IJ SOSY
30.0000 mg | PREFILLED_SYRINGE | INTRAMUSCULAR | Status: DC
  Administered 2023-03-10 – 2023-03-11 (×2): 30 mg via SUBCUTANEOUS
  Filled 2023-03-10 (×2): qty 0.3

## 2023-03-10 MED ORDER — SODIUM CHLORIDE 0.9 % IV SOLN: INTRAVENOUS | Status: DC

## 2023-03-10 MED ORDER — SODIUM CHLORIDE 0.9 % IV SOLN
1.0000 g | INTRAVENOUS | Status: DC
  Administered 2023-03-10 – 2023-03-11 (×2): 1 g via INTRAVENOUS
  Filled 2023-03-10 (×2): qty 10

## 2023-03-10 NOTE — Progress Notes (Signed)
PT Cancellation Note  Patient Details Name: Kristin Browning MRN: [id number] DOB: 06-26-1929   Cancelled Treatment:    Reason Eval/Treat Not Completed: Fatigue/lethargy limiting ability to participate (Consult received and chart reviewed.  Patient grossly lethargic; unable to maintain alertness for adequate participation for session. Will continue to follow and initiate as appropriate.)  Of note, per notes, family interested in/considering hospice; awaiting meeting to establish goals of care.    Sharelle Burditt H. Manson Passey, PT, DPT, NCS 03/10/23, 2:49 PM [phone]

## 2023-03-10 NOTE — Plan of Care (Signed)
Alerted by lab that pt was refusing to have labs drawn.  Was also notified that she had pulled out her only IV.  She also refused to have vital signs taken. Will notify dr at shift change.  This is a definite contrast to how she behaved when she was admitted to the floor at 22:30 last night.

## 2023-03-10 NOTE — Progress Notes (Signed)
OT Cancellation Note  Patient Details Name: Kristin Browning MRN: [id number] DOB: 1930/02/04   Cancelled Treatment:    Reason Eval/Treat Not Completed: Fatigue/lethargy limiting ability to participate. Attempt x2 today, pt very lethargic, not willing to open her eyes. BP checked and still high at 173/101. Not appropriate for OT/PT eval today. Hospice also requested by family to be consulted. Will re-attempt for eval tomorrow as appropriate.  Constance Goltz 03/10/2023, 2:46 PM

## 2023-03-10 NOTE — Progress Notes (Signed)
[facility] ROOM 105 Hospital Hospice Liaison Note  Received request from, Carmelia Bake, Transitions of Care Manager, for hospice services at home after discharge.  Spoke with daughterto initiate education related to hospice philosophy, services, and team approach to care. Daughter verbalized understanding of information given. DME needs discussed.  Patient has the following equipment in the home: [facility], Wheelchi[employer], and walker Patient/family requests the following equipment in the home:Hospital bed and an over the bed table.   Patient will be going to her daughter's address at [address] [place], [place] [address].  Daughter is Graciella Belton May and her phone number is [phone].     Please send signed and completed DNR home with the patient/family.  Please provide prescriptions at discharge as needed to ensure ongoing symptom management.   [facility] information and contact numbers given to daughter.  Please call with any Hospice related questions or concerns.  Thank you for the opportunity to participate in this patient's care.  Redge Gainer, [facility] Liaison [phone]

## 2023-03-10 NOTE — Progress Notes (Addendum)
[facility] 105 [employer] Hospice hospital liaison note:  Hospital Liaison went by patient's room today to see if daughter was visiting.  Since no one was in the room with patient, HL left  a voice mail message with patient's daughter, asking that she call to discuss Hospice services.  Awaiting a return phone call.    Please call with any Hospice related questions or concerns.     [facility] Liaison [phone]

## 2023-03-10 NOTE — Progress Notes (Addendum)
Palliative consult received.  Hospice liaison contacted for evaluation per family request. No acute palliative needs at this time. Will assess for needs 12/21.   No Charge.  Leeanne Deed, DNP, AGNP-C Palliative Medicine  Please call Palliative Medicine team phone with any questions [phone]. For individual providers please see AMION.

## 2023-03-10 NOTE — TOC Progression Note (Signed)
Transition of Care [facility]) - Progression Note    Patient Details  Name: Kristin Browning MRN: [id number] Date of Birth: 05/19/1929  [facility]) CM/SW Contact  Allena Katz, LCSW Phone Number: 03/10/2023, 11:13 AM  Clinical Narrative:   [facility] evaluating. TOC following.         Expected Discharge Plan and Services                                               Social Determinants of Health (SDOH) Interventions SDOH Screenings   Food Insecurity: No Food Insecurity (03/10/2023)  Housing: Low Risk  (03/10/2023)  Transportation Needs: Unknown (03/10/2023)  Utilities: Not At Risk (03/10/2023)  Financial Resource Strain: Low Risk  (12/13/2022)   Received from [facility]  Tobacco Use: Low Risk  (04/19/2022)   Received from [facility]    Readmission Risk Interventions     No data to display

## 2023-03-10 NOTE — Progress Notes (Signed)
PROGRESS NOTE    Kristin Browning  WUJ:[id number] DOB: 12-Dec-1929 DOA: 03/09/2023 PCP: Lauro Regulus, MD   Assessment & Plan:   Principal Problem:   Weakness Active Problems:   DVT (deep venous thrombosis) (HCC)   CKD (chronic kidney disease) stage 3, GFR 30-59 ml/min (HCC)   UTI (urinary tract infection)   Sacral ulcer (HCC)  Assessment and Plan: Weakness: likely secondary to advanced age and chronic medical conditions ( DM2, CKDIIIb). CT head shows no acute intracranial abnormalities. PT/OT consulted. Continue on IVFs  Left DVT: continue on lovenox    Sacral ulcer: present on admission. Wound care consulted    UTI: urine cx is pending. Continue on IV rocephin    CKDIIIb: Cr is labile. Avoid nephrotoxic meds       DVT prophylaxis: lovenox  Code Status: DNR Family Communication:  Disposition Plan: depends on PT/OT recs  Level of care: Med-Surg Consultants:    Procedures:  Antimicrobials: rocephin    Subjective: Pt c/o fatigue   Objective: Vitals:   03/09/23 1800 03/09/23 1830 03/09/23 2229 03/10/23 0805  BP:  (!) 147/93 (!) 153/111 (!) 161/115  Pulse:  74 77 98  Resp:  18 16 18   Temp:   98.4 F (36.9 C) (!) 97.1 F (36.2 C)  TempSrc:   Oral   SpO2:  92% 96%   Weight:   67.1 kg   Height: 5' 2.99" (1.6 m)  5\' 3"  (1.6 m)    No intake or output data in the 24 hours ending 03/10/23 0807 Filed Weights   03/09/23 1755 03/09/23 2229  Weight: 71.3 kg 67.1 kg    Examination:  General exam: Appears calm and comfortable  Respiratory system: Clear to auscultation. Respiratory effort normal. Cardiovascular system: S1 & S2 +. No rubs, gallops or clicks. Gastrointestinal system: Abdomen is nondistended, soft and nontender. Normal bowel sounds heard. Central nervous system: lethargic  Psychiatry: Judgement and insight poor    Data Reviewed: I have personally reviewed following labs and imaging studies  CBC: Recent Labs  Lab 03/09/23 0911  WBC 10.2   NEUTROABS 6.5  HGB 14.0  HCT 43.2  MCV 98.2  PLT 223   Basic Metabolic Panel: Recent Labs  Lab 03/09/23 0911  NA 136  K 4.2  CL 105  CO2 19*  GLUCOSE 144*  BUN 36*  CREATININE 1.32*  CALCIUM 9.2   GFR: Estimated Creatinine Clearance: 25.5 mL/min (A) (by C-G formula based on SCr of 1.32 mg/dL (H)). Liver Function Tests: Recent Labs  Lab 03/09/23 0911  AST 37  ALT 26  ALKPHOS 55  BILITOT 0.8  PROT 7.6  ALBUMIN 3.5   No results for input(s): "LIPASE", "AMYLASE" in the last 168 hours. No results for input(s): "AMMONIA" in the last 168 hours. Coagulation Profile: No results for input(s): "INR", "PROTIME" in the last 168 hours. Cardiac Enzymes: Recent Labs  Lab 03/09/23 0911  CKTOTAL 25*   BNP (last 3 results) No results for input(s): "PROBNP" in the last 8760 hours. HbA1C: No results for input(s): "HGBA1C" in the last 72 hours. CBG: No results for input(s): "GLUCAP" in the last 168 hours. Lipid Profile: No results for input(s): "CHOL", "HDL", "LDLCALC", "TRIG", "CHOLHDL", "LDLDIRECT" in the last 72 hours. Thyroid Function Tests: Recent Labs    03/09/23 0911  TSH 5.264*  FREET4 1.01   Anemia Panel: No results for input(s): "VITAMINB12", "FOLATE", "FERRITIN", "TIBC", "IRON", "RETICCTPCT" in the last 72 hours. Sepsis Labs: No results for input(s): "PROCALCITON", "LATICACIDVEN"  in the last 168 hours.  Recent Results (from the past 240 hours)  Resp panel by RT-PCR (RSV, Flu A&B, Covid) Anterior Nasal Swab     Status: None   Collection Time: 03/09/23 11:10 AM   Specimen: Anterior Nasal Swab  Result Value Ref Range Status   SARS Coronavirus 2 by RT PCR NEGATIVE NEGATIVE Final    Comment: (NOTE) SARS-CoV-2 target nucleic acids are NOT DETECTED.  The SARS-CoV-2 RNA is generally detectable in upper respiratory specimens during the acute phase of infection. The lowest concentration of SARS-CoV-2 viral copies this assay can detect is 138 copies/mL. A  negative result does not preclude SARS-Cov-2 infection and should not be used as the sole basis for treatment or other patient management decisions. A negative result may occur with  improper specimen collection/handling, submission of specimen other than nasopharyngeal swab, presence of viral mutation(s) within the areas targeted by this assay, and inadequate number of viral copies(<138 copies/mL). A negative result must be combined with clinical observations, patient history, and epidemiological information. The expected result is Negative.  Fact Sheet for Patients:  BloggerCourse.com  Fact Sheet for Healthcare Providers:  SeriousBroker.it  This test is no t yet approved or cleared by the [place] FDA and  has been authorized for detection and/or diagnosis of SARS-CoV-2 by FDA under an Emergency Use Authorization (EUA). This EUA will remain  in effect (meaning this test can be used) for the duration of the COVID-19 declaration under Section 564(b)(1) of the Act, 21 U.S.C.section 360bbb-3(b)(1), unless the authorization is terminated  or revoked sooner.       Influenza A by PCR NEGATIVE NEGATIVE Final   Influenza B by PCR NEGATIVE NEGATIVE Final    Comment: (NOTE) The Xpert Xpress SARS-CoV-2/FLU/RSV plus assay is intended as an aid in the diagnosis of influenza from Nasopharyngeal swab specimens and should not be used as a sole basis for treatment. Nasal washings and aspirates are unacceptable for Xpert Xpress SARS-CoV-2/FLU/RSV testing.  Fact Sheet for Patients: BloggerCourse.com  Fact Sheet for Healthcare Providers: SeriousBroker.it  This test is not yet approved or cleared by the [place] FDA and has been authorized for detection and/or diagnosis of SARS-CoV-2 by FDA under an Emergency Use Authorization (EUA). This EUA will remain in effect (meaning this test can  be used) for the duration of the COVID-19 declaration under Section 564(b)(1) of the Act, 21 U.S.C. section 360bbb-3(b)(1), unless the authorization is terminated or revoked.     Resp Syncytial Virus by PCR NEGATIVE NEGATIVE Final    Comment: (NOTE) Fact Sheet for Patients: BloggerCourse.com  Fact Sheet for Healthcare Providers: SeriousBroker.it  This test is not yet approved or cleared by the [place] FDA and has been authorized for detection and/or diagnosis of SARS-CoV-2 by FDA under an Emergency Use Authorization (EUA). This EUA will remain in effect (meaning this test can be used) for the duration of the COVID-19 declaration under Section 564(b)(1) of the Act, 21 U.S.C. section 360bbb-3(b)(1), unless the authorization is terminated or revoked.  Performed at [facility], [address], [place], [place] [address]          Radiology Studies: US Venous Img Lower Bilateral Result Date: 03/09/2023 CLINICAL DATA:  Bilateral lower extremity pain, history of DVT EXAM: BILATERAL LOWER EXTREMITY VENOUS DOPPLER ULTRASOUND TECHNIQUE: Gray-scale sonography with graded compression, as well as color Doppler and duplex ultrasound were performed to evaluate the lower extremity deep venous systems from the level of the common femoral vein and  including the common femoral, femoral, profunda femoral, popliteal and calf veins including the posterior tibial, peroneal and gastrocnemius veins when visible. The superficial great saphenous vein was also interrogated. Spectral Doppler was utilized to evaluate flow at rest and with distal augmentation maneuvers in the common femoral, femoral and popliteal veins. COMPARISON:  None Available. FINDINGS: RIGHT LOWER EXTREMITY Common Femoral Vein: No evidence of thrombus. Normal compressibility, respiratory phasicity and response to augmentation. Saphenofemoral Junction: No evidence of thrombus.  Normal compressibility and flow on color Doppler imaging. Profunda Femoral Vein: No evidence of thrombus. Normal compressibility and flow on color Doppler imaging. Femoral Vein: No evidence of thrombus. Normal compressibility, respiratory phasicity and response to augmentation. Popliteal Vein: No evidence of thrombus. Normal compressibility, respiratory phasicity and response to augmentation. Calf Veins: No evidence of thrombus. Normal compressibility and flow on color Doppler imaging. Superficial Great Saphenous Vein: No evidence of thrombus. Normal compressibility. Venous Reflux:  None. Other Findings:  None. LEFT LOWER EXTREMITY Common Femoral Vein: No evidence of thrombus. Normal compressibility, respiratory phasicity and response to augmentation. Saphenofemoral Junction: No evidence of thrombus. Normal compressibility and flow on color Doppler imaging. Profunda Femoral Vein: No evidence of thrombus. Normal compressibility and flow on color Doppler imaging. Femoral Vein: The femoral vein is noncompressible in the left mid thigh. No evidence of color flow in the segment. Additionally, there is some associated eccentric wall thickening. Findings suggest acute on chronic DVT. Popliteal Vein: No evidence of thrombus. Normal compressibility, respiratory phasicity and response to augmentation. Calf Veins: No evidence of thrombus. Normal compressibility and flow on color Doppler imaging. Superficial Great Saphenous Vein: No evidence of thrombus. Normal compressibility. Venous Reflux:  None. Other Findings:  None. IMPRESSION: 1. Positive for probable acute on chronic DVT within the femoral vein in the mid aspect of the left thigh. 2. No evidence of acute DVT in the right lower extremity. Electronically Signed   By: Malachy Moan M.D.   On: 03/09/2023 16:44   DG Chest Portable 1 View Result Date: 03/09/2023 CLINICAL DATA:  Weakness, confusion EXAM: PORTABLE CHEST 1 VIEW COMPARISON:  06/01/2020 FINDINGS: Single  frontal view of the chest was obtained, with the patient rotated toward the left. Cardiac silhouette is unremarkable. Continued ectasia and atherosclerosis of the thoracic aorta, accentuated by patient rotation. Lung volumes are diminished, without acute airspace disease, effusion, or pneumothorax. No acute bony abnormalities. IMPRESSION: 1. Low lung volumes.  No acute process. Electronically Signed   By: Sharlet Salina M.D.   On: 03/09/2023 11:12   CT HEAD WO CONTRAST ( ) Result Date: 03/09/2023 CLINICAL DATA:  Mental status change with unknown cause EXAM: CT HEAD WITHOUT CONTRAST TECHNIQUE: Contiguous axial images were obtained from the base of the skull through the vertex without intravenous contrast. RADIATION DOSE REDUCTION: This exam was performed according to the departmental dose-optimization program which includes automated exposure control, adjustment of the mA and/or kV according to patient size and/or use of iterative reconstruction technique. COMPARISON:  06/01/2020 FINDINGS: Brain: No evidence of acute infarction, hemorrhage, hydrocephalus, extra-axial collection or mass lesion/mass effect. Chronic small vessel ischemia in the cerebral white matter. Stable thickening of the falx including fatty metaplasia. Generalized atrophy. Vascular: No hyperdense vessel or unexpected calcification. Skull: Normal. Negative for fracture or focal lesion. Sinuses/Orbits: No acute finding. IMPRESSION: 1. No acute finding. 2. Atrophy and chronic small vessel ischemia which appears similar to 2022. Electronically Signed   By: Tiburcio Pea M.D.   On: 03/09/2023 10:38  Scheduled Meds:  enoxaparin (LOVENOX) injection  30 mg Subcutaneous Q24H   Continuous Infusions:   LOS: 1 day       Charise Killian, MD [facility] Pager [phone]  If 7PM-7AM, please contact night-coverage [email or web address] 03/10/2023, 8:07 AM

## 2023-03-10 NOTE — Plan of Care (Signed)

## 2023-03-11 DIAGNOSIS — R531 Weakness: Secondary | ICD-10-CM | POA: Diagnosis not present

## 2023-03-11 DIAGNOSIS — R627 Adult failure to thrive: Secondary | ICD-10-CM | POA: Diagnosis not present

## 2023-03-11 LAB — BASIC METABOLIC PANEL
Anion gap: 9 (ref 5–15)
BUN: 23 mg/dL (ref 8–23)
CO2: 22 mmol/L (ref 22–32)
Calcium: 8.7 mg/dL — ABNORMAL LOW (ref 8.9–10.3)
Chloride: 110 mmol/L (ref 98–111)
Creatinine, Ser: 0.87 mg/dL (ref 0.44–1.00)
GFR, Estimated: >60 mL/min (ref >60)
Glucose, Bld: 91 mg/dL (ref 70–99)
Potassium: 3.6 mmol/L (ref 3.5–5.1)
Sodium: 141 mmol/L (ref 135–145)

## 2023-03-11 LAB — URINE CULTURE: Culture: >=100000 — AB

## 2023-03-11 LAB — CBC
HCT: 40 % (ref 36.0–46.0)
Hemoglobin: 12.9 g/dL (ref 12.0–15.0)
MCH: 32.6 pg (ref 26.0–34.0)
MCHC: 32.3 g/dL (ref 30.0–36.0)
MCV: 101 fL — ABNORMAL HIGH (ref 80.0–100.0)
Platelets: 199 10*3/uL (ref 150–400)
RBC: 3.96 MIL/uL (ref 3.87–5.11)
RDW: 14.3 % (ref 11.5–15.5)
WBC: 7.7 10*3/uL (ref 4.0–10.5)
nRBC: 0 % (ref 0.0–0.2)

## 2023-03-11 LAB — THYROID PANEL WITH TSH
Free Thyroxine Index: 2.3 (ref 1.2–4.9)
T3 Uptake Ratio: 29 % (ref 24–39)
T4, Total: 7.9 ug/dL (ref 4.5–12.0)
TSH: 3.2 u[IU]/mL (ref 0.450–4.500)

## 2023-03-11 MED ORDER — MORPHINE SULFATE (CONCENTRATE) 10 MG /0.5 ML PO SOLN: 10.0000 mg | ORAL | 0 refills | Status: AC | PRN

## 2023-03-11 MED ORDER — ENOXAPARIN SODIUM 40 MG/0.4ML IJ SOSY: 40.0000 mg | PREFILLED_SYRINGE | Freq: Every day | INTRAMUSCULAR | Status: DC

## 2023-03-11 NOTE — TOC Progression Note (Signed)
Transition of Care [facility]) - Progression Note    Patient Details  Name: NKAUJ MASKER MRN: [id number] Date of Birth: 07-15-1929  [facility]) CM/SW Contact  Rodney Langton, RN Phone Number: 03/11/2023, 11:34 AM  Clinical Narrative:     Per Diannia Ruder with [facility], working on discharge home with hospice tomorrow.  Family confirmed that DME will be delivered today to the home.        Expected Discharge Plan and Services                                               Social Determinants of Health (SDOH) Interventions SDOH Screenings   Food Insecurity: No Food Insecurity (03/10/2023)  Housing: Low Risk  (03/10/2023)  Transportation Needs: Unknown (03/10/2023)  Utilities: Not At Risk (03/10/2023)  Financial Resource Strain: Low Risk  (12/13/2022)   Received from [facility]  Tobacco Use: Low Risk  (04/19/2022)   Received from [facility]    Readmission Risk Interventions     No data to display

## 2023-03-11 NOTE — Progress Notes (Signed)
Patient's BP was 155/123 she is refusing RN to recheck her BP.   RN notified Manuela Schwartz, NP of the above information  No orders Received

## 2023-03-11 NOTE — Progress Notes (Signed)
patient removed the mitts RN placed on her to prevent her from removing her IV. She also removed the wrap from the IV that the ICU RN placed to protect the IV. Patient is attempting to bite this RN when RN tries to put her mitts back on and she is also grabbing the RNs fingers in attempt to break them after her attempted biting did not work. RN tried educating the patient but she is confused and agitated.   RN notified Manuela Schwartz, NP of the above information.  RN had the Charge RN assist in replacing the wrap on the IV and the mitts on the patient.

## 2023-03-11 NOTE — Progress Notes (Signed)
Patient pulled out Left hand IV. RN placed gauze on IV site. RN placed IV team consult.   IV team unable to place IV x4 attempts. RN notified Manuela Schwartz, NP.   ICU RN placed and IV in R wrist and wrapped the IV. Mitts placed on patient to prevent IV from being pulled out

## 2023-03-11 NOTE — Progress Notes (Signed)
[facility] Liaison Note  Follow up on new referral for hospice at home.  Patient will d/c home today.  Spoke with daughter who states DME being delivered today around 5pm.  She will come pick up her mother at the hospital after DME is delivered.  Ongoing coordination and communication with hospital medical team through final disposition.  Norris Cross, RN Nurse Liaison [phone]

## 2023-03-11 NOTE — Plan of Care (Signed)
  Problem: Clinical Measurements: Goal: Respiratory complications will improve Outcome: Progressing Goal: Cardiovascular complication will be avoided Outcome: Progressing   Problem: Elimination: Goal: Will not experience complications related to urinary retention Outcome: Progressing   Problem: Safety: Goal: Ability to remain free from injury will improve Outcome: Progressing   Problem: Nutrition: Goal: Adequate nutrition will be maintained Outcome: Not Progressing

## 2023-03-11 NOTE — Progress Notes (Signed)
OT Cancellation Note  Patient Details Name: Kristin Browning MRN: [id number] DOB: October 12, 1929   Cancelled Treatment:    Reason Eval/Treat Not Completed: Other (comment). Per secure chat, MD confirms pt is going home with hospice care and no longer needs therapy consults. Will sign off.   Arman Filter., MPH, MS, OTR/L ascom [phone] 03/11/23, 8:02 AM

## 2023-03-11 NOTE — Progress Notes (Signed)
PT Cancellation Note  Patient Details Name: Kristin Browning MRN: [id number] DOB: 1930-02-24   Cancelled Treatment:     Reason Eval/Treat Not Completed: Other (comment). Per secure chat, MD confirms pt is going home with hospice care and no longer needs therapy consults. Will sign off.      Loyalty Arentz H Iara Monds 03/11/2023, 8:04 AM

## 2023-03-11 NOTE — Discharge Summary (Signed)
Physician Discharge Summary  MARYLAN WATER GNF:[id number] DOB: July 10, 1929 DOA: 03/09/2023  PCP: Lauro Regulus, MD  Admit date: 03/09/2023 Discharge date: 03/11/2023  Admitted From: home  Disposition:  home   Recommendations for Outpatient Follow-up:  Follow up with hospice provider ASAP   Home Health: no  Equipment/Devices:  Discharge Condition: hospice  CODE STATUS: DNR  Diet recommendation: As tolerated    Brief/Interim Summary: HPI was taken from Dr. Alvester Morin: Kristin Browning is a 89 y.o. female with medical history significant of hypertension, cardiomyopathy, type 2 diabetes, stage III CKD presenting with weakness, failure to thrive.  History primarily from patient's daughter in the setting of generalized confusion.  Per report, patient with overall progressive decline over several months to greater than 1 year.  Patient with decreased eating, worsening confusion and weakness.  Has had inability to ambulate over multiple weeks.  No fevers or chills.  No nausea or vomiting.  Daughter found patient sitting in the doorway attempted to leave the house.  Has had significant difficulty eating and progressive weight loss.  No reported dysuria or increased urinary frequency. Presented to the ER afebrile, hemodynamically stable.  Satting well on room air.  White count 10.2, hemoglobin 14, platelets 223, urinalysis leukocyte positive.  CK20 5.  COVID flu and RSV negative.  Platelet count 223.  Discharge Diagnoses:  Principal Problem:   Weakness Active Problems:   DVT (deep venous thrombosis) (HCC)   CKD (chronic kidney disease) stage 3, GFR 30-59 ml/min (HCC)   UTI (urinary tract infection)   Sacral ulcer (HCC) Failure to thrive: secondary to all below. D/c home w/ hospice   Weakness: likely secondary to advanced age and chronic medical conditions ( DM2, CKDIIIb). CT head shows no acute intracranial abnormalities. D/c home w/ hospice   Left DVT: continue on lovenox    Sacral ulcer:  present on admission. Continue w/ wound care    UTI: urine cx is growing providencia. Continue on IV rocephin    CKDIIIb: Cr is labile. Avoid nephrotoxic meds   Discharge Instructions  Discharge Instructions     Diet general   Complete by: As directed    Discharge instructions   Complete by: As directed    F/u hospice provider as soon as possible   Discharge wound care:   Complete by: As directed    Daily      Comments: Cleanse buttock wounds with saline, pat dry. After dry, apply single layer of xeroform gauze over dark purple linear areas, top with foam, change every other day   Increase activity slowly   Complete by: As directed       Allergies as of 03/11/2023       Reactions   Ace Inhibitors Other (See Comments)   Citalopram Other (See Comments)   Doxycycline Hyclate Other (See Comments)   Dysuria   Nsaids Other (See Comments)        Medication List     TAKE these medications    carvedilol 6.25 MG tablet Commonly known as: COREG Take 6.25 mg by mouth 2 (two) times daily.   Cholecalciferol 50 MCG (2000 UT) Tabs Take 2,000 Units by mouth daily.   diltiazem 240 MG 24 hr capsule Commonly known as: CARDIZEM CD Take 240 mg by mouth daily.   DULoxetine 30 MG capsule Commonly known as: CYMBALTA Take 30 mg by mouth daily.   morphine CONCENTRATE 10 mg / 0.5 ml concentrated solution Take 0.5 mLs (10 mg total) by mouth every 4 (  four) hours as needed for up to 3 days for severe pain (pain score 7-10), moderate pain (pain score 4-6), anxiety or shortness of breath.   oxybutynin 10 MG 24 hr tablet Commonly known as: DITROPAN-XL Take 10 mg by mouth daily.   pravastatin 40 MG tablet Commonly known as: PRAVACHOL Take 1 tablet by mouth at bedtime.   PreserVision AREDS 2+Multi Vit Caps Take 1 capsule by mouth daily.   timolol 0.5 % ophthalmic solution Commonly known as: TIMOPTIC Place 1 drop into both eyes daily.               Discharge Care  Instructions  (From admission, onward)           Start     Ordered   03/11/23 0000  Discharge wound care:       Comments: Daily      Comments: Cleanse buttock wounds with saline, pat dry. After dry, apply single layer of xeroform gauze over dark purple linear areas, top with foam, change every other day   03/11/23 1243            Allergies  Allergen Reactions   Ace Inhibitors Other (See Comments)   Citalopram Other (See Comments)   Doxycycline Hyclate Other (See Comments)    Dysuria   Nsaids Other (See Comments)    Consultations: Hospice    Procedures/Studies: US Venous Img Lower Bilateral Result Date: 03/09/2023 CLINICAL DATA:  Bilateral lower extremity pain, history of DVT EXAM: BILATERAL LOWER EXTREMITY VENOUS DOPPLER ULTRASOUND TECHNIQUE: Gray-scale sonography with graded compression, as well as color Doppler and duplex ultrasound were performed to evaluate the lower extremity deep venous systems from the level of the common femoral vein and including the common femoral, femoral, profunda femoral, popliteal and calf veins including the posterior tibial, peroneal and gastrocnemius veins when visible. The superficial great saphenous vein was also interrogated. Spectral Doppler was utilized to evaluate flow at rest and with distal augmentation maneuvers in the common femoral, femoral and popliteal veins. COMPARISON:  None Available. FINDINGS: RIGHT LOWER EXTREMITY Common Femoral Vein: No evidence of thrombus. Normal compressibility, respiratory phasicity and response to augmentation. Saphenofemoral Junction: No evidence of thrombus. Normal compressibility and flow on color Doppler imaging. Profunda Femoral Vein: No evidence of thrombus. Normal compressibility and flow on color Doppler imaging. Femoral Vein: No evidence of thrombus. Normal compressibility, respiratory phasicity and response to augmentation. Popliteal Vein: No evidence of thrombus. Normal compressibility,  respiratory phasicity and response to augmentation. Calf Veins: No evidence of thrombus. Normal compressibility and flow on color Doppler imaging. Superficial Great Saphenous Vein: No evidence of thrombus. Normal compressibility. Venous Reflux:  None. Other Findings:  None. LEFT LOWER EXTREMITY Common Femoral Vein: No evidence of thrombus. Normal compressibility, respiratory phasicity and response to augmentation. Saphenofemoral Junction: No evidence of thrombus. Normal compressibility and flow on color Doppler imaging. Profunda Femoral Vein: No evidence of thrombus. Normal compressibility and flow on color Doppler imaging. Femoral Vein: The femoral vein is noncompressible in the left mid thigh. No evidence of color flow in the segment. Additionally, there is some associated eccentric wall thickening. Findings suggest acute on chronic DVT. Popliteal Vein: No evidence of thrombus. Normal compressibility, respiratory phasicity and response to augmentation. Calf Veins: No evidence of thrombus. Normal compressibility and flow on color Doppler imaging. Superficial Great Saphenous Vein: No evidence of thrombus. Normal compressibility. Venous Reflux:  None. Other Findings:  None. IMPRESSION: 1. Positive for probable acute on chronic DVT within the femoral vein in the  mid aspect of the left thigh. 2. No evidence of acute DVT in the right lower extremity. Electronically Signed   By: Malachy Moan M.D.   On: 03/09/2023 16:44   DG Chest Portable 1 View Result Date: 03/09/2023 CLINICAL DATA:  Weakness, confusion EXAM: PORTABLE CHEST 1 VIEW COMPARISON:  06/01/2020 FINDINGS: Single frontal view of the chest was obtained, with the patient rotated toward the left. Cardiac silhouette is unremarkable. Continued ectasia and atherosclerosis of the thoracic aorta, accentuated by patient rotation. Lung volumes are diminished, without acute airspace disease, effusion, or pneumothorax. No acute bony abnormalities. IMPRESSION: 1.  Low lung volumes.  No acute process. Electronically Signed   By: Sharlet Salina M.D.   On: 03/09/2023 11:12   CT HEAD WO CONTRAST ( ) Result Date: 03/09/2023 CLINICAL DATA:  Mental status change with unknown cause EXAM: CT HEAD WITHOUT CONTRAST TECHNIQUE: Contiguous axial images were obtained from the base of the skull through the vertex without intravenous contrast. RADIATION DOSE REDUCTION: This exam was performed according to the departmental dose-optimization program which includes automated exposure control, adjustment of the mA and/or kV according to patient size and/or use of iterative reconstruction technique. COMPARISON:  06/01/2020 FINDINGS: Brain: No evidence of acute infarction, hemorrhage, hydrocephalus, extra-axial collection or mass lesion/mass effect. Chronic small vessel ischemia in the cerebral white matter. Stable thickening of the falx including fatty metaplasia. Generalized atrophy. Vascular: No hyperdense vessel or unexpected calcification. Skull: Normal. Negative for fracture or focal lesion. Sinuses/Orbits: No acute finding. IMPRESSION: 1. No acute finding. 2. Atrophy and chronic small vessel ischemia which appears similar to 2022. Electronically Signed   By: Tiburcio Pea M.D.   On: 03/09/2023 10:38   (Echo, Carotid, EGD, Colonoscopy, ERCP)    Subjective: Pt is resting comfortably    Discharge Exam: Vitals:   03/11/23 0259 03/11/23 0747  BP: (!) 148/89 (!) 161/90  Pulse: 89 88  Resp: 16 20  Temp: 98.2 F (36.8 C) 98.4 F (36.9 C)  SpO2: 93% 94%   Vitals:   03/10/23 2025 03/10/23 2200 03/11/23 0259 03/11/23 0747  BP: (!) 155/123  (!) 148/89 (!) 161/90  Pulse: 82  89 88  Resp: 16  16 20   Temp: (!) 96.9 F (36.1 C) 97.6 F (36.4 C) 98.2 F (36.8 C) 98.4 F (36.9 C)  TempSrc: Axillary     SpO2: 94%  93% 94%  Weight:      Height:        General: Pt is alert, awake, not in acute distress Cardiovascular: S1/S2 +, no rubs, no gallops Respiratory:  decreased breath sounds b/l  Abdominal: Soft, NT, ND, bowel sounds + Extremities:  no cyanosis    The results of significant diagnostics from this hospitalization (including imaging, microbiology, ancillary and laboratory) are listed below for reference.     Microbiology: Recent Results (from the past 240 hours)  Resp panel by RT-PCR (RSV, Flu A&B, Covid) Anterior Nasal Swab     Status: None   Collection Time: 03/09/23 11:10 AM   Specimen: Anterior Nasal Swab  Result Value Ref Range Status   SARS Coronavirus 2 by RT PCR NEGATIVE NEGATIVE Final    Comment: (NOTE) SARS-CoV-2 target nucleic acids are NOT DETECTED.  The SARS-CoV-2 RNA is generally detectable in upper respiratory specimens during the acute phase of infection. The lowest concentration of SARS-CoV-2 viral copies this assay can detect is 138 copies/mL. A negative result does not preclude SARS-Cov-2 infection and should not be used as the sole basis for  treatment or other patient management decisions. A negative result may occur with  improper specimen collection/handling, submission of specimen other than nasopharyngeal swab, presence of viral mutation(s) within the areas targeted by this assay, and inadequate number of viral copies(<138 copies/mL). A negative result must be combined with clinical observations, patient history, and epidemiological information. The expected result is Negative.  Fact Sheet for Patients:  BloggerCourse.com  Fact Sheet for Healthcare Providers:  SeriousBroker.it  This test is no t yet approved or cleared by the [place] FDA and  has been authorized for detection and/or diagnosis of SARS-CoV-2 by FDA under an Emergency Use Authorization (EUA). This EUA will remain  in effect (meaning this test can be used) for the duration of the COVID-19 declaration under Section 564(b)(1) of the Act, 21 U.S.C.section 360bbb-3(b)(1), unless the  authorization is terminated  or revoked sooner.       Influenza A by PCR NEGATIVE NEGATIVE Final   Influenza B by PCR NEGATIVE NEGATIVE Final    Comment: (NOTE) The Xpert Xpress SARS-CoV-2/FLU/RSV plus assay is intended as an aid in the diagnosis of influenza from Nasopharyngeal swab specimens and should not be used as a sole basis for treatment. Nasal washings and aspirates are unacceptable for Xpert Xpress SARS-CoV-2/FLU/RSV testing.  Fact Sheet for Patients: BloggerCourse.com  Fact Sheet for Healthcare Providers: SeriousBroker.it  This test is not yet approved or cleared by the [place] FDA and has been authorized for detection and/or diagnosis of SARS-CoV-2 by FDA under an Emergency Use Authorization (EUA). This EUA will remain in effect (meaning this test can be used) for the duration of the COVID-19 declaration under Section 564(b)(1) of the Act, 21 U.S.C. section 360bbb-3(b)(1), unless the authorization is terminated or revoked.     Resp Syncytial Virus by PCR NEGATIVE NEGATIVE Final    Comment: (NOTE) Fact Sheet for Patients: BloggerCourse.com  Fact Sheet for Healthcare Providers: SeriousBroker.it  This test is not yet approved or cleared by the [place] FDA and has been authorized for detection and/or diagnosis of SARS-CoV-2 by FDA under an Emergency Use Authorization (EUA). This EUA will remain in effect (meaning this test can be used) for the duration of the COVID-19 declaration under Section 564(b)(1) of the Act, 21 U.S.C. section 360bbb-3(b)(1), unless the authorization is terminated or revoked.  Performed at [facility], [address], [place], [place] [address]   Urine Culture     Status: Abnormal   Collection Time: 03/09/23  1:25 PM   Specimen: Urine, Clean Catch  Result Value Ref Range Status   Specimen Description   Final     URINE, CLEAN CATCH Performed at [facility], [address] [place], [place] [address]    Special Requests   Final    NONE Performed at [facility], [address] [place], [place] [address]    Culture >=100,000 COLONIES/mL PROVIDENCIA RETTGERI (A)  Final   Report Status 03/11/2023 FINAL  Final   Organism ID, Bacteria PROVIDENCIA RETTGERI (A)  Final      Susceptibility   Providencia rettgeri - MIC*    AMPICILLIN >=32 RESISTANT Resistant     CEFEPIME <=0.12 SENSITIVE Sensitive     CEFTRIAXONE <=0.25 SENSITIVE Sensitive     CIPROFLOXACIN 0.5 INTERMEDIATE Intermediate     GENTAMICIN <=1 SENSITIVE Sensitive     IMIPENEM 1 SENSITIVE Sensitive     NITROFURANTOIN 256 RESISTANT Resistant     TRIMETH/SULFA <=20 SENSITIVE Sensitive     AMPICILLIN/SULBACTAM >=32 RESISTANT Resistant  PIP/TAZO <=4 SENSITIVE Sensitive ug/mL    * >=100,000 COLONIES/mL PROVIDENCIA RETTGERI     Labs: BNP (last 3 results) No results for input(s): "BNP" in the last 8760 hours. Basic Metabolic Panel: Recent Labs  Lab 03/09/23 0911 03/11/23 0437  NA 136 141  K 4.2 3.6  CL 105 110  CO2 19* 22  GLUCOSE 144* 91  BUN 36* 23  CREATININE 1.32* 0.87  CALCIUM 9.2 8.7*   Liver Function Tests: Recent Labs  Lab 03/09/23 0911  AST 37  ALT 26  ALKPHOS 55  BILITOT 0.8  PROT 7.6  ALBUMIN 3.5   No results for input(s): "LIPASE", "AMYLASE" in the last 168 hours. No results for input(s): "AMMONIA" in the last 168 hours. CBC: Recent Labs  Lab 03/09/23 0911 03/11/23 0437  WBC 10.2 7.7  NEUTROABS 6.5  --   HGB 14.0 12.9  HCT 43.2 40.0  MCV 98.2 101.0*  PLT 223 199   Cardiac Enzymes: Recent Labs  Lab 03/09/23 0911  CKTOTAL 25*   BNP: Invalid input(s): "POCBNP" CBG: No results for input(s): "GLUCAP" in the last 168 hours. D-Dimer No results for input(s): "DDIMER" in the last 72 hours. Hgb A1c No results for input(s): "HGBA1C" in the last 72 hours. Lipid Profile No  results for input(s): "CHOL", "HDL", "LDLCALC", "TRIG", "CHOLHDL", "LDLDIRECT" in the last 72 hours. Thyroid function studies Recent Labs    03/09/23 0911  TSH 5.264*   Anemia work up No results for input(s): "VITAMINB12", "FOLATE", "FERRITIN", "TIBC", "IRON", "RETICCTPCT" in the last 72 hours. Urinalysis    Component Value Date/Time   COLORURINE AMBER (A) 03/09/2023 1330   APPEARANCEUR TURBID (A) 03/09/2023 1330   LABSPEC 1.015 03/09/2023 1330   PHURINE 7.0 03/09/2023 1330   GLUCOSEU NEGATIVE 03/09/2023 1330   HGBUR MODERATE (A) 03/09/2023 1330   BILIRUBINUR NEGATIVE 03/09/2023 1330   KETONESUR NEGATIVE 03/09/2023 1330   PROTEINUR 100 (A) 03/09/2023 1330   NITRITE NEGATIVE 03/09/2023 1330   LEUKOCYTESUR MODERATE (A) 03/09/2023 1330   Sepsis Labs Recent Labs  Lab 03/09/23 0911 03/11/23 0437  WBC 10.2 7.7   Microbiology Recent Results (from the past 240 hours)  Resp panel by RT-PCR (RSV, Flu A&B, Covid) Anterior Nasal Swab     Status: None   Collection Time: 03/09/23 11:10 AM   Specimen: Anterior Nasal Swab  Result Value Ref Range Status   SARS Coronavirus 2 by RT PCR NEGATIVE NEGATIVE Final    Comment: (NOTE) SARS-CoV-2 target nucleic acids are NOT DETECTED.  The SARS-CoV-2 RNA is generally detectable in upper respiratory specimens during the acute phase of infection. The lowest concentration of SARS-CoV-2 viral copies this assay can detect is 138 copies/mL. A negative result does not preclude SARS-Cov-2 infection and should not be used as the sole basis for treatment or other patient management decisions. A negative result may occur with  improper specimen collection/handling, submission of specimen other than nasopharyngeal swab, presence of viral mutation(s) within the areas targeted by this assay, and inadequate number of viral copies(<138 copies/mL). A negative result must be combined with clinical observations, patient history, and  epidemiological information. The expected result is Negative.  Fact Sheet for Patients:  BloggerCourse.com  Fact Sheet for Healthcare Providers:  SeriousBroker.it  This test is no t yet approved or cleared by the [place] FDA and  has been authorized for detection and/or diagnosis of SARS-CoV-2 by FDA under an Emergency Use Authorization (EUA). This EUA will remain  in effect (meaning this test can  be used) for the duration of the COVID-19 declaration under Section 564(b)(1) of the Act, 21 U.S.C.section 360bbb-3(b)(1), unless the authorization is terminated  or revoked sooner.       Influenza A by PCR NEGATIVE NEGATIVE Final   Influenza B by PCR NEGATIVE NEGATIVE Final    Comment: (NOTE) The Xpert Xpress SARS-CoV-2/FLU/RSV plus assay is intended as an aid in the diagnosis of influenza from Nasopharyngeal swab specimens and should not be used as a sole basis for treatment. Nasal washings and aspirates are unacceptable for Xpert Xpress SARS-CoV-2/FLU/RSV testing.  Fact Sheet for Patients: BloggerCourse.com  Fact Sheet for Healthcare Providers: SeriousBroker.it  This test is not yet approved or cleared by the [place] FDA and has been authorized for detection and/or diagnosis of SARS-CoV-2 by FDA under an Emergency Use Authorization (EUA). This EUA will remain in effect (meaning this test can be used) for the duration of the COVID-19 declaration under Section 564(b)(1) of the Act, 21 U.S.C. section 360bbb-3(b)(1), unless the authorization is terminated or revoked.     Resp Syncytial Virus by PCR NEGATIVE NEGATIVE Final    Comment: (NOTE) Fact Sheet for Patients: BloggerCourse.com  Fact Sheet for Healthcare Providers: SeriousBroker.it  This test is not yet approved or cleared by the [place] FDA and has been  authorized for detection and/or diagnosis of SARS-CoV-2 by FDA under an Emergency Use Authorization (EUA). This EUA will remain in effect (meaning this test can be used) for the duration of the COVID-19 declaration under Section 564(b)(1) of the Act, 21 U.S.C. section 360bbb-3(b)(1), unless the authorization is terminated or revoked.  Performed at [facility], [address] [place], [place] [address]   Urine Culture     Status: Abnormal   Collection Time: 03/09/23  1:25 PM   Specimen: Urine, Clean Catch  Result Value Ref Range Status   Specimen Description   Final    URINE, CLEAN CATCH Performed at [facility], [address] [place], [place] [address]    Special Requests   Final    NONE Performed at [facility], [address] [place], [place] [address]    Culture >=100,000 COLONIES/mL PROVIDENCIA RETTGERI (A)  Final   Report Status 03/11/2023 FINAL  Final   Organism ID, Bacteria PROVIDENCIA RETTGERI (A)  Final      Susceptibility   Providencia rettgeri - MIC*    AMPICILLIN >=32 RESISTANT Resistant     CEFEPIME <=0.12 SENSITIVE Sensitive     CEFTRIAXONE <=0.25 SENSITIVE Sensitive     CIPROFLOXACIN 0.5 INTERMEDIATE Intermediate     GENTAMICIN <=1 SENSITIVE Sensitive     IMIPENEM 1 SENSITIVE Sensitive     NITROFURANTOIN 256 RESISTANT Resistant     TRIMETH/SULFA <=20 SENSITIVE Sensitive     AMPICILLIN/SULBACTAM >=32 RESISTANT Resistant     PIP/TAZO <=4 SENSITIVE Sensitive ug/mL    * >=100,000 COLONIES/mL PROVIDENCIA RETTGERI     Time coordinating discharge: Over 30 minutes  SIGNED:   Charise Killian, MD  [facility] 03/11/2023, 12:44 PM Pager   If 7PM-7AM, please contact night-coverage [email or web address]

## 2023-03-12 NOTE — Progress Notes (Signed)
RN assisted the patient in putting on clothes her daughter brought for her. RN removed the patient's IV. RN went over discharge instructions with the patient and her daughter. RN helped pack up the patient's belongings. RN transported the patient via wheelchair to the patient's daughter's car and assisted the patient into the car.

## 2023-06-20 DEATH — deceased
